# Patient Record
Sex: Female | Born: 2000 | Race: White | Hispanic: No | Marital: Single | Smoking: Never smoker
Health system: Southern US, Community
[De-identification: ages and names within clinical notes are randomized; demographics above are authoritative.]

## PROBLEM LIST (undated history)

## (undated) DIAGNOSIS — F419 Anxiety disorder, unspecified: Secondary | ICD-10-CM

## (undated) DIAGNOSIS — K219 Gastro-esophageal reflux disease without esophagitis: Secondary | ICD-10-CM

## (undated) DIAGNOSIS — K9041 Non-celiac gluten sensitivity: Secondary | ICD-10-CM

## (undated) DIAGNOSIS — K589 Irritable bowel syndrome without diarrhea: Secondary | ICD-10-CM

## (undated) DIAGNOSIS — T7840XA Allergy, unspecified, initial encounter: Secondary | ICD-10-CM

## (undated) DIAGNOSIS — M40209 Unspecified kyphosis, site unspecified: Secondary | ICD-10-CM

## (undated) DIAGNOSIS — F32A Depression, unspecified: Secondary | ICD-10-CM

## (undated) HISTORY — DX: Depression, unspecified: F32.A

## (undated) HISTORY — DX: Unspecified kyphosis, site unspecified: M40.209

## (undated) HISTORY — DX: Anxiety disorder, unspecified: F41.9

## (undated) HISTORY — DX: Non-celiac gluten sensitivity: K90.41

## (undated) HISTORY — PX: WISDOM TOOTH EXTRACTION: SHX21

## (undated) HISTORY — DX: Allergy, unspecified, initial encounter: T78.40XA

## (undated) HISTORY — DX: Gastro-esophageal reflux disease without esophagitis: K21.9

## (undated) HISTORY — DX: Irritable bowel syndrome, unspecified: K58.9

---

## 2020-10-07 ENCOUNTER — Ambulatory Visit: Payer: 59 | Admitting: Family Medicine

## 2020-11-27 ENCOUNTER — Other Ambulatory Visit: Payer: Self-pay

## 2020-11-28 ENCOUNTER — Ambulatory Visit (INDEPENDENT_AMBULATORY_CARE_PROVIDER_SITE_OTHER): Payer: 59 | Admitting: Family Medicine

## 2020-11-28 ENCOUNTER — Encounter: Payer: Self-pay | Admitting: Family Medicine

## 2020-11-28 VITALS — BP 106/68 | HR 108 | Temp 98.0°F | Ht 68.0 in | Wt 191.6 lb

## 2020-11-28 DIAGNOSIS — R051 Acute cough: Secondary | ICD-10-CM | POA: Diagnosis not present

## 2020-11-28 DIAGNOSIS — J0100 Acute maxillary sinusitis, unspecified: Secondary | ICD-10-CM | POA: Diagnosis not present

## 2020-11-28 DIAGNOSIS — F339 Major depressive disorder, recurrent, unspecified: Secondary | ICD-10-CM

## 2020-11-28 MED ORDER — AMOXICILLIN 500 MG PO CAPS: 500.0000 mg | ORAL_CAPSULE | Freq: Three times a day (TID) | ORAL | 0 refills | Status: AC

## 2020-11-28 MED ORDER — PREDNISONE 10 MG PO TABS: 10.0000 mg | ORAL_TABLET | Freq: Two times a day (BID) | ORAL | 0 refills | Status: AC

## 2020-11-28 NOTE — Progress Notes (Signed)
New Patient Office Visit  Subjective:  Patient ID: Karen Webb, female    DOB: [birth year]  Age: 20 y.o. MRN: [id number]  CC:  Chief Complaint  Patient presents with   Establish Care    NP/establish care concerns about cough and congestion x 1 week.     HPI Karen Webb presents for establishment of care and a 10-day history of URI symptoms with nasal congestion postnasal drip sore throat dry cough and headache.  There has been some improvement other than a lingering cough without wheeze or asthma history, facial pressure in her cheekbones and green rhinorrhea.  History of depression treated well with Wellbutrin XL.  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  She is in counseling.  Sees GYN for female care.  Past Medical History:  Diagnosis Date   Anxiety    Depression     Past Surgical History:  Procedure Laterality Date   WISDOM TOOTH EXTRACTION      Family History  Problem Relation Age of Onset   Alcohol abuse Mother    Depression Mother    Anxiety disorder Mother    Hypertension Father     Social History   Socioeconomic History   Marital status: Single    Spouse name: Not on file   Number of children: Not on file   Years of education: Not on file   Highest education level: Not on file  Occupational History   Not on file  Tobacco Use   Smoking status: Never   Smokeless tobacco: Never  Vaping Use   Vaping Use: Never used  Substance and Sexual Activity   Alcohol use: Never   Drug use: Never   Sexual activity: Yes    Birth control/protection: I.U.D.  Other Topics Concern   Not on file  Social History Narrative   Not on file   Social Determinants of Health   Financial Resource Strain: Not on file  Food Insecurity: Not on file  Transportation Needs: Not on file  Physical Activity: Not on file  Stress: Not on file  Social Connections: Not on file  Intimate Partner  Violence: Not on file    ROS Review of Systems  Constitutional:  Negative for diaphoresis, fatigue, fever and unexpected weight change.  HENT:  Positive for congestion, postnasal drip, rhinorrhea, sinus pressure and sinus pain. Negative for trouble swallowing.   Eyes:  Negative for photophobia and visual disturbance.  Respiratory:  Positive for cough. Negative for shortness of breath and wheezing.   Gastrointestinal:  Negative for abdominal pain, nausea and vomiting.  Genitourinary: Negative.   Musculoskeletal:  Negative for arthralgias and myalgias.  Skin:  Negative for rash.  Neurological:  Positive for headaches. Negative for speech difficulty and weakness.  Psychiatric/Behavioral:  Positive for dysphoric mood.   Depression screen PHQ 2/9 11/28/2020  Decreased Interest 0  Down, Depressed, Hopeless 0  PHQ - 2 Score 0     Objective:   Today's Vitals: BP 106/68 (BP Location: Right Arm, Patient Position: Sitting, Cuff Size: Normal)   Pulse (!) 108   Temp 98 F (36.7 C) (Temporal)   Ht 5\' 8"  (1.727 m)   Wt 191 lb 9.6 oz (86.9 kg)   SpO2 98%   BMI 29.13 kg/m   Physical Exam Vitals and nursing note reviewed.  Constitutional:      General: She is not in acute distress.    Appearance: Normal appearance. She  is not ill-appearing, toxic-appearing or diaphoretic.  HENT:     Head: Normocephalic and atraumatic.     Right Ear: Tympanic membrane, ear canal and external ear normal.     Left Ear: Tympanic membrane, ear canal and external ear normal.     Mouth/Throat:     Mouth: Mucous membranes are moist.     Pharynx: Oropharynx is clear. No oropharyngeal exudate or posterior oropharyngeal erythema.  Eyes:     General: No scleral icterus.       Right eye: No discharge.        Left eye: No discharge.     Extraocular Movements: Extraocular movements intact.     Conjunctiva/sclera: Conjunctivae normal.     Pupils: Pupils are equal, round, and reactive to light.  Cardiovascular:      Rate and Rhythm: Normal rate and regular rhythm.  Pulmonary:     Effort: Pulmonary effort is normal. No respiratory distress.     Breath sounds: Normal breath sounds. No wheezing or rales.  Musculoskeletal:     Cervical back: No rigidity or tenderness.  Lymphadenopathy:     Cervical: No cervical adenopathy.  Skin:    General: Skin is warm and dry.  Neurological:     Mental Status: She is alert and oriented to person, place, and time.  Psychiatric:        Mood and Affect: Mood normal.        Behavior: Behavior normal.    Assessment & Plan:   Problem List Items Addressed This Visit       Respiratory   Acute non-recurrent maxillary sinusitis - Primary   Relevant Medications   amoxicillin (AMOXIL) 500 MG capsule   predniSONE (DELTASONE) 10 MG tablet     Other   Depression, recurrent (HCC)   Relevant Medications   buPROPion (WELLBUTRIN XL) 150 MG 24 hr tablet   Acute cough   Relevant Medications   predniSONE (DELTASONE) 10 MG tablet    Outpatient Encounter Medications as of 11/28/2020  Medication Sig   amoxicillin (AMOXIL) 500 MG capsule Take 1 capsule (500 mg total) by mouth 3 (three) times daily for 10 days.   buPROPion (WELLBUTRIN XL) 150 MG 24 hr tablet Take 150 mg by mouth every morning.   levonorgestrel (KYLEENA) 19.5 MG IUD by Intrauterine route.   predniSONE (DELTASONE) 10 MG tablet Take 1 tablet (10 mg total) by mouth 2 (two) times daily with a meal for 7 days.   No facility-administered encounter medications on file as of 11/28/2020.    Follow-up: No follow-ups on file.   Karen Maw, MD

## 2021-01-28 ENCOUNTER — Ambulatory Visit: Payer: 59 | Admitting: Family Medicine

## 2021-02-17 ENCOUNTER — Other Ambulatory Visit: Payer: Self-pay

## 2021-02-17 ENCOUNTER — Encounter: Payer: Self-pay | Admitting: Family Medicine

## 2021-02-17 ENCOUNTER — Ambulatory Visit (INDEPENDENT_AMBULATORY_CARE_PROVIDER_SITE_OTHER): Payer: 59 | Admitting: Family Medicine

## 2021-02-17 ENCOUNTER — Ambulatory Visit: Payer: 59 | Admitting: Family Medicine

## 2021-02-17 VITALS — BP 130/78 | HR 104 | Temp 96.9°F | Ht 68.0 in | Wt 189.8 lb

## 2021-02-17 DIAGNOSIS — R634 Abnormal weight loss: Secondary | ICD-10-CM | POA: Diagnosis not present

## 2021-02-17 DIAGNOSIS — F339 Major depressive disorder, recurrent, unspecified: Secondary | ICD-10-CM | POA: Diagnosis not present

## 2021-02-17 DIAGNOSIS — Z114 Encounter for screening for human immunodeficiency virus [HIV]: Secondary | ICD-10-CM

## 2021-02-17 DIAGNOSIS — K921 Melena: Secondary | ICD-10-CM | POA: Diagnosis not present

## 2021-02-17 DIAGNOSIS — Z1159 Encounter for screening for other viral diseases: Secondary | ICD-10-CM | POA: Diagnosis not present

## 2021-02-17 LAB — COMPREHENSIVE METABOLIC PANEL
ALT: 5 U/L (ref 0–35)
AST: 15 U/L (ref 0–37)
Albumin: 4.2 g/dL (ref 3.5–5.2)
Alkaline Phosphatase: 48 U/L (ref 39–117)
BUN: 14 mg/dL (ref 6–23)
CO2: 29 mEq/L (ref 19–32)
Calcium: 9.4 mg/dL (ref 8.4–10.5)
Chloride: 104 mEq/L (ref 96–112)
Creatinine, Ser: 0.79 mg/dL (ref 0.40–1.20)
GFR: 107.23 mL/min (ref >60.00)
Glucose, Bld: 93 mg/dL (ref 70–99)
Potassium: 4 mEq/L (ref 3.5–5.1)
Sodium: 138 mEq/L (ref 135–145)
Total Bilirubin: 1 mg/dL (ref 0.2–1.2)
Total Protein: 6.5 g/dL (ref 6.0–8.3)

## 2021-02-17 LAB — CBC
HCT: 40.9 % (ref 36.0–46.0)
Hemoglobin: 13.6 g/dL (ref 12.0–15.0)
MCHC: 33.3 g/dL (ref 30.0–36.0)
MCV: 91.9 fl (ref 78.0–100.0)
Platelets: 186 10*3/uL (ref 150.0–400.0)
RBC: 4.45 Mil/uL (ref 3.87–5.11)
RDW: 13.1 % (ref 11.5–15.5)
WBC: 5.8 10*3/uL (ref 4.0–10.5)

## 2021-02-17 LAB — SEDIMENTATION RATE: Sed Rate: 3 mm/hr (ref 0–20)

## 2021-02-17 NOTE — Progress Notes (Signed)
Established Patient Office Visit  Subjective:  Patient ID: Karen Webb, female    DOB: 09-12-2000  Age: 21 y.o. MRN: [id number]  CC:  Chief Complaint  Patient presents with   Blood In Stools    Concerns about blood stool becoming worse patient would like referral to GI. Some stomach cramping.     HPI Karen Webb presents for evaluation of a 1 year history of intermittent hematochezia with intermittent abdominal pain and intestinal cramping.  There is been a 10 pound weight loss.  Stools may be loose or well formed.  There is some relief with abdominal pain with stooling and/or passing gas.  She appreciates reflux a few times a months relieved by Tums and/or food ingestion.  There have been no fevers or chills.  History of hemorrhoids but these have not been noted in some time now.  For contraception she uses an IUD and there is no menstrual flow.  Denies dysuria or discharge.  She is in a stable committed relationship.  Her mother has Crohn's disease.  Her significant other has ulcerative colitis.  Continues to do well with Wellbutrin XL.  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  Past Medical History:  Diagnosis Date   Anxiety    Depression     Past Surgical History:  Procedure Laterality Date   WISDOM TOOTH EXTRACTION      Family History  Problem Relation Age of Onset   Alcohol abuse Mother    Depression Mother    Anxiety disorder Mother    Hypertension Father     Social History   Socioeconomic History   Marital status: Single    Spouse name: Not on file   Number of children: Not on file   Years of education: Not on file   Highest education level: Not on file  Occupational History   Not on file  Tobacco Use   Smoking status: Never   Smokeless tobacco: Never  Vaping Use   Vaping Use: Never used  Substance and Sexual Activity   Alcohol use: Never   Drug use: Never   Sexual activity: Yes    Birth control/protection: I.U.D.  Other  Topics Concern   Not on file  Social History Narrative   Not on file   Social Determinants of Health   Financial Resource Strain: Not on file  Food Insecurity: Not on file  Transportation Needs: Not on file  Physical Activity: Not on file  Stress: Not on file  Social Connections: Not on file  Intimate Partner Violence: Not on file    Outpatient Medications Prior to Visit  Medication Sig Dispense Refill   buPROPion (WELLBUTRIN XL) 150 MG 24 hr tablet Take 150 mg by mouth every morning.     levonorgestrel (KYLEENA) 19.5 MG IUD by Intrauterine route.     No facility-administered medications prior to visit.    Not on File  ROS Review of Systems  Constitutional:  Negative for chills, diaphoresis, fatigue, fever and unexpected weight change.  HENT: Negative.    Eyes:  Negative for photophobia and visual disturbance.  Respiratory:  Positive for wheezing.   Cardiovascular: Negative.   Gastrointestinal:  Positive for abdominal pain and blood in stool. Negative for anal bleeding and constipation.  Neurological:  Negative for speech difficulty and weakness.  Hematological:  Does not bruise/bleed easily.  Depression screen [facility] 2/9 02/17/2021 11/28/2020 11/28/2020  Decreased Interest 0 1 0  Down, Depressed, Hopeless 0 3 0  PHQ -  2 Score 0 4 0  Altered sleeping - 3 -  Tired, decreased energy - 3 -  Change in appetite - 0 -  Feeling bad or failure about yourself  - 1 -  Trouble concentrating - 2 -  Moving slowly or fidgety/restless - 1 -  Suicidal thoughts - 0 -  PHQ-9 Score - 14 -  Difficult doing work/chores - Somewhat difficult -       Objective:    Physical Exam Vitals and nursing note reviewed.  Constitutional:      General: She is not in acute distress.    Appearance: Normal appearance. She is not ill-appearing, toxic-appearing or diaphoretic.  HENT:     Head: Normocephalic and atraumatic.     Right Ear: External ear normal.     Left Ear: External ear normal.      Mouth/Throat:     Mouth: Mucous membranes are moist.     Pharynx: Oropharynx is clear. No oropharyngeal exudate or posterior oropharyngeal erythema.  Eyes:     General:        Right eye: No discharge.        Left eye: No discharge.     Extraocular Movements: Extraocular movements intact.     Conjunctiva/sclera: Conjunctivae normal.     Pupils: Pupils are equal, round, and reactive to light.  Cardiovascular:     Rate and Rhythm: Normal rate and regular rhythm.  Pulmonary:     Effort: Pulmonary effort is normal.     Breath sounds: Normal breath sounds.  Abdominal:     General: Abdomen is flat. Bowel sounds are normal. There is no distension.     Palpations: Abdomen is soft. There is no mass.     Tenderness: There is abdominal tenderness (mild llq ttp.). There is no guarding or rebound.     Hernia: No hernia is present.  Musculoskeletal:     Cervical back: No rigidity or tenderness.  Lymphadenopathy:     Cervical: No cervical adenopathy.  Skin:    General: Skin is warm and dry.  Neurological:     Mental Status: She is alert and oriented to person, place, and time.  Psychiatric:        Mood and Affect: Mood normal.        Behavior: Behavior normal.    BP 130/78 (BP Location: Right Arm, Patient Position: Sitting, Cuff Size: Large)    Pulse (!) 104    Temp (!) 96.9 F (36.1 C) (Temporal)    Ht 5' 8" (1.727 m)    Wt 189 lb 12.8 oz (86.1 kg)    SpO2 98%    BMI 28.86 kg/m  Wt Readings from Last 3 Encounters:  02/17/21 189 lb 12.8 oz (86.1 kg)  11/28/20 191 lb 9.6 oz (86.9 kg)     Health Maintenance Due  Topic Date Due   CHLAMYDIA SCREENING  Never done   Hepatitis C Screening  Never done   PAP-Cervical Cytology Screening  02/10/2021   PAP SMEAR-Modifier  02/10/2021    There are no preventive care reminders to display for this patient.  No results found for: TSH No results found for: WBC, HGB, HCT, MCV, PLT No results found for: NA, K, CHLORIDE, CO2, GLUCOSE, BUN,  CREATININE, BILITOT, ALKPHOS, AST, ALT, PROT, ALBUMIN, CALCIUM, ANIONGAP, EGFR, GFR No results found for: CHOL No results found for: HDL No results found for: LDLCALC No results found for: TRIG No results found for: CHOLHDL No results found for: HGBA1C  Assessment & Plan:   Problem List Items Addressed This Visit       Other   Depression, recurrent ([place]) - Primary   Encounter for hepatitis C screening test for low risk patient   Relevant Orders   Hepatitis C antibody   Weight loss   Relevant Orders   Ambulatory referral to Gastroenterology   CBC   Comprehensive metabolic panel   Sedimentation rate   Hematochezia   Relevant Orders   Ambulatory referral to Gastroenterology   CBC    No orders of the defined types were placed in this encounter.   Follow-up: Return in about 3 months (around 05/17/2021).   Continue Wellbutrin 150 mg daily.  Information was given on hematochezia.  GI referral for consultation. [name], MD

## 2021-02-18 ENCOUNTER — Encounter: Payer: Self-pay | Admitting: Physician Assistant

## 2021-02-18 ENCOUNTER — Encounter: Payer: Self-pay | Admitting: Family Medicine

## 2021-02-18 DIAGNOSIS — K219 Gastro-esophageal reflux disease without esophagitis: Secondary | ICD-10-CM

## 2021-02-18 LAB — HEPATITIS C ANTIBODY
Hepatitis C Ab: NONREACTIVE
SIGNAL TO CUT-OFF: <0.02 (ref <1.00)

## 2021-02-18 LAB — HIV ANTIBODY (ROUTINE TESTING W REFLEX): HIV 1&2 Ab, 4th Generation: NONREACTIVE

## 2021-02-20 MED ORDER — PANTOPRAZOLE SODIUM 40 MG PO TBEC: 40.0000 mg | DELAYED_RELEASE_TABLET | Freq: Every day | ORAL | 3 refills | Status: DC

## 2021-02-28 ENCOUNTER — Ambulatory Visit: Payer: 59 | Admitting: Family Medicine

## 2021-02-28 NOTE — Progress Notes (Signed)
03/03/2021 Karen Webb [id number] 09/06/2000   ASSESSMENT AND PLAN: :  Hematochezia Hemorrhoids on exam- supp sent in Get KUB to evaluate stool burden, if large volume stools unlikely colitis and will start linzess sent in, if no stool burden on Xray will schedule for EGD/Colon -     CBC with Differential/Platelet; Future -     Comprehensive metabolic panel; Future -     TSH; Future -     DG Abd 1 View; Future -     IgA; Future -     Tissue transglutaminase, IgA; Future -     C-reactive protein; Future -     Calprotectin, Fecal; Future -     linaclotide (LINZESS) 145 MCG CAPS capsule; Take 1 capsule (145 mcg total) by mouth daily before breakfast.  Weight loss -     TSH; Future - per patient 9 lbs weight loss, per our records 2-3 lbs - check labs, consider imaging versus CT if continues  Gastroesophageal reflux disease without esophagitis -     H. pylori antibody, IgG; Future ? If URI she has been treated for is LPR - do trial of pantoprazole 40 mg once daily - avoid NSAIDS, ETOH.     Patient Care Team: [name], MD as PCP - General (Family Medicine)  HISTORY OF PRESENT ILLNESS: 21 y.o. female referred by [name],*, with a past medical history of depression and others listed below presents for evaluation of hematochezia and weight loss.   Of note patient is on Wellbutrin. Karen Webb boyfriend is here with her.   She has constipation, has BM once a week or every other day, has gone as long as 9 days without.  She states she has a history of hemorrhoids, has had mucus with BRB.  Had ripping sensation, BRB in toilet or on TP, some burning/itching pain.  Ab pain diffuse lower AB pain, went to GYN first  and now better.  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  She has nausea 1-2 x a day for a year, no vomiting. She has  GERD last 3 months. She was sick several months ago, URI that would not go away clearing, hoarseness, will have choking with mucus coming up, rare wheezing, no SOB.  Had melena when on prednisone per patient.   No dysphagia.  Rare ETOH, socially only. She vapes, no drug use.  Excedrin once every 2 days for headaches.   Per patient she is 9 lbs weight loss in 1 month.  Mom with crohn's, maternal GM with celiac.  Wt Readings from Last 3 Encounters:  03/03/21 188 lb 8 oz (85.5 kg)  02/17/21 189 lb 12.8 oz (86.1 kg)  11/28/20 191 lb 9.6 oz (86.9 kg)    External labs and notes reviewed this visit: CBC  02/17/2021  HGB 13.6 MCV 91.9 without evidence of anemia WBC 5.8 Platelets 186.0 Kidney function 02/17/2021  BUN 14 Cr 0.79  Potassium 4.0   LFTs 02/17/2021  AST 15 ALT 5 Alkphos 48 TBili 1.0 No TSH seen Sed rate negative  Current Medications:   Current Outpatient Medications (Endocrine & Metabolic):    levonorgestrel (KYLEENA) 19.5 MG IUD, by Intrauterine route.      Current Outpatient Medications (Other):    buPROPion (WELLBUTRIN XL) 150 MG 24 hr tablet, Take 150 mg by mouth every morning.   linaclotide Rolan Lipa)  145 MCG CAPS capsule, Take 1 capsule (145 mcg total) by mouth daily before breakfast.   pantoprazole (PROTONIX) 40 MG tablet, Take 1 tablet (40 mg total) by mouth daily.  Medical History:  Past Medical History:  Diagnosis Date   Anxiety    Depression    Gluten-sensitive enteropathy    IBS (irritable bowel syndrome)    Allergies: No Known Allergies   Surgical History:  She  has a past surgical history that includes Wisdom tooth extraction. Family History:  Her family history includes ADD / ADHD in her sister; Alcohol abuse in her mother; Anxiety disorder in her mother; Bipolar disorder in her mother; Celiac disease in her maternal grandmother; Crohn's disease in her mother; Depression in her mother and sister; Heart disease in her paternal grandfather; Hypertension  in her father; Irritable bowel syndrome in her mother; Other in her mother; Sleep apnea in her father. Social History:   reports that she has never smoked. She has never used smokeless tobacco. She reports current alcohol use. She reports that she does not use drugs.  REVIEW OF SYSTEMS  : All other systems reviewed and negative except where noted in the History of Present Illness.   PHYSICAL EXAM: BP 130/60 (BP Location: Left Arm, Patient Position: Sitting, Cuff Size: Normal)    Pulse (!) 104    Ht 5' 7.75" (1.721 m) Comment: height measured without shoes   Wt 188 lb 8 oz (85.5 kg)    BMI 28.87 kg/m  General:   Pleasant, well developed female in no acute distress Head:  Normocephalic and atraumatic. Eyes: sclerae anicteric,conjunctive pink  Heart:  tachycardia regular rhythm Pulm: Clear anteriorly; no wheezing Abdomen:  Soft, Obese AB, skin exam normal, Normal bowel sounds. mild tenderness in the lower abdomen. Without guarding and Without rebound, without hepatomegaly. Rectal: Normal external rectal exam, normal rectal tone, + internal hemorrhoids appreciated, no masses, non tender, scant brown stool, hemoccult negative  Extremities:  Without edema. Msk:  Symmetrical without gross deformities. Peripheral pulses intact.  Neurologic:  Alert and  oriented x4;  grossly normal neurologically. Skin:   Dry and intact without significant lesions or rashes. Psychiatric: Demonstrates good judgement and reason without abnormal affect or behaviors.   Vladimir Crofts, PA-C 3:08 PM

## 2021-03-03 ENCOUNTER — Ambulatory Visit (INDEPENDENT_AMBULATORY_CARE_PROVIDER_SITE_OTHER): Payer: 59 | Admitting: Physician Assistant

## 2021-03-03 ENCOUNTER — Other Ambulatory Visit (INDEPENDENT_AMBULATORY_CARE_PROVIDER_SITE_OTHER): Payer: 59

## 2021-03-03 ENCOUNTER — Other Ambulatory Visit: Payer: Self-pay

## 2021-03-03 ENCOUNTER — Encounter: Payer: Self-pay | Admitting: Physician Assistant

## 2021-03-03 ENCOUNTER — Ambulatory Visit (INDEPENDENT_AMBULATORY_CARE_PROVIDER_SITE_OTHER)
Admission: RE | Admit: 2021-03-03 | Discharge: 2021-03-03 | Disposition: A | Discharge status: Home / Self Care | Payer: 59 | Source: Ambulatory Visit | Attending: Physician Assistant | Admitting: Physician Assistant

## 2021-03-03 VITALS — BP 130/60 | HR 104 | Ht 67.75 in | Wt 188.5 lb

## 2021-03-03 DIAGNOSIS — R634 Abnormal weight loss: Secondary | ICD-10-CM

## 2021-03-03 DIAGNOSIS — K921 Melena: Secondary | ICD-10-CM

## 2021-03-03 DIAGNOSIS — K219 Gastro-esophageal reflux disease without esophagitis: Secondary | ICD-10-CM

## 2021-03-03 LAB — COMPREHENSIVE METABOLIC PANEL
ALT: 16 U/L (ref 0–35)
AST: 18 U/L (ref 0–37)
Albumin: 4.7 g/dL (ref 3.5–5.2)
Alkaline Phosphatase: 62 U/L (ref 39–117)
BUN: 7 mg/dL (ref 6–23)
CO2: 31 mEq/L (ref 19–32)
Calcium: 9.6 mg/dL (ref 8.4–10.5)
Chloride: 103 mEq/L (ref 96–112)
Creatinine, Ser: 0.69 mg/dL (ref 0.40–1.20)
GFR: 124.37 mL/min (ref >60.00)
Glucose, Bld: 99 mg/dL (ref 70–99)
Potassium: 3.7 mEq/L (ref 3.5–5.1)
Sodium: 138 mEq/L (ref 135–145)
Total Bilirubin: 0.7 mg/dL (ref 0.2–1.2)
Total Protein: 8 g/dL (ref 6.0–8.3)

## 2021-03-03 LAB — CBC WITH DIFFERENTIAL/PLATELET
Basophils Absolute: 0 10*3/uL (ref 0.0–0.1)
Basophils Relative: 0.7 % (ref 0.0–3.0)
Eosinophils Absolute: 0.1 10*3/uL (ref 0.0–0.7)
Eosinophils Relative: 0.8 % (ref 0.0–5.0)
HCT: 40 % (ref 36.0–46.0)
Hemoglobin: 13.8 g/dL (ref 12.0–15.0)
Lymphocytes Relative: 24.4 % (ref 12.0–46.0)
Lymphs Abs: 1.6 10*3/uL (ref 0.7–4.0)
MCHC: 34.5 g/dL (ref 30.0–36.0)
MCV: 87.5 fl (ref 78.0–100.0)
Monocytes Absolute: 0.5 10*3/uL (ref 0.1–1.0)
Monocytes Relative: 7.6 % (ref 3.0–12.0)
Neutro Abs: 4.3 10*3/uL (ref 1.4–7.7)
Neutrophils Relative %: 66.5 % (ref 43.0–77.0)
Platelets: 253 10*3/uL (ref 150.0–400.0)
RBC: 4.57 Mil/uL (ref 3.87–5.11)
RDW: 13 % (ref 11.5–15.5)
WBC: 6.5 10*3/uL (ref 4.0–10.5)

## 2021-03-03 LAB — C-REACTIVE PROTEIN: CRP: <1 mg/dL (ref 0.5–20.0)

## 2021-03-03 LAB — TSH: TSH: 1.64 u[IU]/mL (ref 0.35–5.50)

## 2021-03-03 MED ORDER — LINACLOTIDE 145 MCG PO CAPS: 145.0000 ug | ORAL_CAPSULE | Freq: Every day | ORAL | 0 refills | Status: DC

## 2021-03-03 MED ORDER — HYDROCORTISONE ACETATE 30 MG RE SUPP: 30.0000 mg | Freq: Every day | RECTAL | 1 refills | Status: DC

## 2021-03-03 NOTE — Patient Instructions (Addendum)
Linzess - START AFTER XRAY RESULTS ARE BACK Take at least 30 minutes before the first meal of the day on an empty stomach You can have a loose stool if you eat a high-fat breakfast. Give it at least 4 days, may have more bowel movements during that time.  If you continue to have severe diarrhea try every other day, if you get AB pain with it stop.  After you are out we can send in a prescription if you did well, there is a prescription card  Linaclotide oral capsules What is this medicine? LINACLOTIDE (lin a KLOE tide) is used to treat irritable bowel syndrome (IBS) with constipation as the main problem. It may also be used for relief of chronic constipation. This medicine may be used for other purposes; ask your health care provider or pharmacist if you have questions. COMMON BRAND NAME(S): Linzess What should I tell my health care provider before I take this medicine? They need to know if you have any of these conditions: history of stool (fecal) impaction now have diarrhea or have diarrhea often other medical condition stomach or intestinal disease, including bowel obstruction or abdominal adhesions an unusual or allergic reaction to linaclotide, other medicines, foods, dyes, or preservatives pregnant or trying to get pregnant breast-feeding How should I use this medicine? Take this medicine by mouth with a glass of water. Follow the directions on the prescription label. Do not cut, crush or chew this medicine. Take on an empty stomach, at least 30 minutes before your first meal of the day. Take your medicine at regular intervals. Do not take your medicine more often than directed. Do not stop taking except on your doctor's advice. A special MedGuide will be given to you by the pharmacist with each prescription and refill. Be sure to read this information carefully each time. Talk to your pediatrician regarding the use of this medicine in children. This medicine is not approved for use in  children. Overdosage: If you think you have taken too much of this medicine contact a poison control center or emergency room at once. NOTE: This medicine is only for you. Do not share this medicine with others. What if I miss a dose? If you miss a dose, just skip that dose. Wait until your next dose, and take only that dose. Do not take double or extra doses. What may interact with this medicine? certain medicines for bowel problems or bladder incontinence (these can cause constipation) This list may not describe all possible interactions. Give your health care provider a list of all the medicines, herbs, non-prescription drugs, or dietary supplements you use. Also tell them if you smoke, drink alcohol, or use illegal drugs. Some items may interact with your medicine. What should I watch for while using this medicine? Visit your doctor for regular check ups. Tell your doctor if your symptoms do not get better or if they get worse. Diarrhea is a common side effect of this medicine. It often begins within 2 weeks of starting this medicine. Stop taking this medicine and call your doctor if you get severe diarrhea. Stop taking this medicine and call your doctor or go to the nearest hospital emergency room right away if you develop unusual or severe stomach-area (abdominal) pain, especially if you also have bright red, bloody stools or black stools that look like tar. What side effects may I notice from receiving this medicine? Side effects that you should report to your doctor or health care professional as soon  as possible: allergic reactions like skin rash, itching or hives, swelling of the face, lips, or tongue black, tarry stools bloody or watery diarrhea new or worsening stomach pain severe or prolonged diarrhea Side effects that usually do not require medical attention (report to your doctor or health care professional if they continue or are bothersome): bloating gas loose stools This list  may not describe all possible side effects. Call your doctor for medical advice about side effects. You may report side effects to FDA at 1-800-FDA-1088. Where should I keep my medicine? Keep out of the reach of children. Store at room temperature between 20 and 25 degrees C (68 and 77 degrees F). Keep this medicine in the original container. Keep tightly closed in a dry place. Do not remove the desiccant packet from the bottle, it helps to protect your medicine from moisture. Throw away any unused medicine after the expiration date. NOTE: This sheet is a summary. It may not cover all possible information. If you have questions about this medicine, talk to your doctor, pharmacist, or health care provider.  2020 Elsevier/Gold Standard (2015-01-31 12:17:04)   Silent reflux: Not all heartburn burns...... GOING TO START ON pantoprazOLE 1 MONTH  What is LPR? Laryngopharyngeal reflux (LPR) or silent reflux is a condition in which acid that is made in the stomach travels up the esophagus (swallowing tube) and gets to the throat. Not everyone with reflux has a lot of heartburn or indigestion. In fact, many people with LPR never have heartburn. This is why LPR is called SILENT REFLUX, and the terms "Silent reflux" and "LPR" are often used interchangeably. Because LPR is silent, it is sometimes difficult to diagnose.  How can you tell if you have LPR?  Chronic hoarseness- Some people have hoarseness that comes and goes throat clearing  Cough It can cause shortness of breath and cause asthma like symptoms. a feeling of a lump in the throat  difficulty swallowing a problem with too much nose and throat drainage.  Some people will feel their esophagus spasm which feels like their heart beating hard and fast, this will usually be after a meal, at rest, or lying down at night.    How do I treat this? Treatment for LPR should be individualized, and your doctor will suggest the best treatment for you.  Generally there are several treatments for LPR: changing habits and diet to reduce reflux,  medications to reduce stomach acid, and  surgery to prevent reflux. Most people with LPR need to modify how and when they eat, as well as take some medication, to get well. Sometimes, nonprescription liquid antacids, such as Maalox, Gelucil and Mylanta are recommended. When used, these antacids should be taken four times each day - one tablespoon one hour after each meal and before bedtime. Dietary and lifestyle changes alone are not often enough to control LPR - medications that reduce stomach acid are also usually needed. These must be prescribed by our doctor.   TIPS FOR REDUCING REFLUX AND LPR Control your LIFE-STYLE and your DIET! If you use tobacco, QUIT.  Smoking makes you reflux. After every cigarette you have some LPR.  Don't wear clothing that is too tight, especially around the waist (trousers, corsets, belts).  Do not lie down just after eating...in fact, do not eat within three hours of bedtime.  You should be on a low-fat diet.  Limit your intake of red meat.  Limit your intake of butter.  Avoid fried foods.  Avoid chocolate  Avoid cheese.  Avoid eggs. Specifically avoid caffeine (especially coffee and tea), soda pop (especially cola) and mints.  Avoid alcoholic beverages, particularly in the evening.  Your provider has requested that you have an abdominal x ray before leaving today. Please go to the basement floor to our Radiology department for the test.   Your provider has requested that you go to the basement level for lab work before leaving today. Press "B" on the elevator. The lab is located at the first door on the left as you exit the elevator.   We have sent the following medications to your pharmacy for you to pick up at your convenience:  Linzess 145 mcg

## 2021-03-04 LAB — H. PYLORI ANTIBODY, IGG: H Pylori IgG: NEGATIVE

## 2021-03-04 LAB — IGA: Immunoglobulin A: 164 mg/dL (ref 47–310)

## 2021-03-04 LAB — TISSUE TRANSGLUTAMINASE, IGA: (tTG) Ab, IgA: <1 U/mL

## 2021-03-05 NOTE — Progress Notes (Signed)
I agree with assessment and plan as outlined by Ms. Collier. 

## 2021-03-06 ENCOUNTER — Other Ambulatory Visit: Payer: 59

## 2021-03-06 DIAGNOSIS — K921 Melena: Secondary | ICD-10-CM

## 2021-03-10 LAB — CALPROTECTIN, FECAL: Calprotectin, Fecal: 75 ug/g (ref 0–120)

## 2021-03-11 ENCOUNTER — Telehealth: Payer: Self-pay | Admitting: Physician Assistant

## 2021-03-11 NOTE — Telephone Encounter (Signed)
Inbound call from patient states Linzess even with discount card is over $400. She would need a replacement   Hydrocortisone 30mg  is expensive and the pharmacist advised if it is changed to at least 25mg  the cost could be as low as $35.

## 2021-03-13 MED ORDER — HYDROCORTISONE ACETATE 25 MG RE SUPP: 25.0000 mg | Freq: Every evening | RECTAL | 0 refills | Status: DC | PRN

## 2021-03-13 NOTE — Telephone Encounter (Signed)
I sent in the 25 mg Hydrocortisone suppositories for the patient. Karen Webb what do you want to give her in place of the Linzess, she said even with the card its $400. Thanks  ?

## 2021-03-13 NOTE — Telephone Encounter (Signed)
Sent this message in my chart: ? ?Try this instead.  ?Can do miralax 17 grams daily with benefiber 1 TBSP daily.  ?Miralax is an osmotic laxative.  ?It only brings more water into the stool.  ?This is safe to take daily.  ?Can take up to 17 gram of miralax twice a day.  ?Mix with juice or coffee.  ?Start 1 capful at night for 3-4 days and reassess your response in 3-4 days.  ?You can increase and decrease the dose based on your response.  ?Remember, it can take up to 3-4 days to take effect OR for the effects to wear off.  ? ?I often pair this with benefiber in the morning to help assure the stool is not too loose.  ?Thanks! ?

## 2021-03-27 ENCOUNTER — Other Ambulatory Visit: Payer: Self-pay

## 2021-03-27 ENCOUNTER — Telehealth: Payer: Self-pay | Admitting: Physician Assistant

## 2021-03-27 MED ORDER — DICYCLOMINE HCL 20 MG PO TABS: 20.0000 mg | ORAL_TABLET | ORAL | 0 refills | Status: DC | PRN

## 2021-03-27 NOTE — Telephone Encounter (Signed)
Spoke with patient regarding PA recommendations. She is aware that she will need to come in for lab work & prescription has been sent to pharmacy. Patient verbalized understanding.  ? ?Karen Webb, just fyi-patient has not been taking Linzess because it was too expensive. She has been doing just the miralax daily.  ?

## 2021-03-27 NOTE — Telephone Encounter (Signed)
Patient seen in the office 02/13/2021 for hematochezia. ?At that time patient had negative fecal calprotectin, negative CRP. ?Patient had negative celiac panel, negative H. pylori. ?At that time patient had no anemia. ?X-ray did show moderate stool burden, rectal exam showed hemorrhoids. ? ?She was given Linzess 145 mcg and hemorrhoid cream. ? ?Please get CBC, CMET, sed rate. ?See if patient is taking Linzess, if not what is she taking for stool burden. ?If unable to tolerate Linzess suggest doing MiraLAX twice daily with fiber. ?Can send in Bentyl 20 mg to take as needed up to 3 times daily for abdominal pain, #20 no refills. ?Go to the ER if unable to pass gas, severe AB pain, unable to hold down food, any shortness of breath of chest pain.  ? ?

## 2021-03-27 NOTE — Telephone Encounter (Signed)
Patient called in with complaints of abdominal pain & bloody stool. She woke up around 3am with excruciating mid-lower abd pain, went to the bathroom with what she thought was diarrhea but it was all bright red blood. Today the pain is better, but she is very lethargic and has a poor appetite. Patient advised to go to urgent care/ED if symptoms worsen in the meantime.  ? ?Karen Webb, please advise. Patient's appointment is not until 3/24 with you. Thank you! ?

## 2021-03-27 NOTE — Telephone Encounter (Signed)
Patient called reporting that she had severe stomach pain last night-woke her up out of a deep sleep.  Went to the bathroom and thought she was having diarrhea but it turned out to be all blood in the stool.  She is very weak and nauseous and feels bad.  She has an upcoming appointment with Estill Bamberg on 3/24 but needs to know what to do in the meantime.  Please call and advise.  Thank you. ?

## 2021-03-28 ENCOUNTER — Other Ambulatory Visit (INDEPENDENT_AMBULATORY_CARE_PROVIDER_SITE_OTHER): Payer: 59

## 2021-03-28 DIAGNOSIS — K921 Melena: Secondary | ICD-10-CM

## 2021-03-28 DIAGNOSIS — R1030 Lower abdominal pain, unspecified: Secondary | ICD-10-CM

## 2021-03-28 LAB — CBC WITH DIFFERENTIAL/PLATELET
Basophils Absolute: 0 10*3/uL (ref 0.0–0.1)
Basophils Relative: 0.6 % (ref 0.0–3.0)
Eosinophils Absolute: 0.1 10*3/uL (ref 0.0–0.7)
Eosinophils Relative: 3.3 % (ref 0.0–5.0)
HCT: 39.6 % (ref 36.0–46.0)
Hemoglobin: 13.7 g/dL (ref 12.0–15.0)
Lymphocytes Relative: 30.9 % (ref 12.0–46.0)
Lymphs Abs: 1.3 10*3/uL (ref 0.7–4.0)
MCHC: 34.7 g/dL (ref 30.0–36.0)
MCV: 87 fl (ref 78.0–100.0)
Monocytes Absolute: 0.6 10*3/uL (ref 0.1–1.0)
Monocytes Relative: 15.4 % — ABNORMAL HIGH (ref 3.0–12.0)
Neutro Abs: 2 10*3/uL (ref 1.4–7.7)
Neutrophils Relative %: 49.8 % (ref 43.0–77.0)
Platelets: 218 10*3/uL (ref 150.0–400.0)
RBC: 4.55 Mil/uL (ref 3.87–5.11)
RDW: 12.9 % (ref 11.5–15.5)
WBC: 4.1 10*3/uL (ref 4.0–10.5)

## 2021-03-28 LAB — COMPREHENSIVE METABOLIC PANEL
ALT: 22 U/L (ref 0–35)
AST: 22 U/L (ref 0–37)
Albumin: 4.7 g/dL (ref 3.5–5.2)
Alkaline Phosphatase: 61 U/L (ref 39–117)
BUN: 9 mg/dL (ref 6–23)
CO2: 27 mEq/L (ref 19–32)
Calcium: 9.7 mg/dL (ref 8.4–10.5)
Chloride: 104 mEq/L (ref 96–112)
Creatinine, Ser: 0.73 mg/dL (ref 0.40–1.20)
GFR: 117.8 mL/min (ref >60.00)
Glucose, Bld: 94 mg/dL (ref 70–99)
Potassium: 4.1 mEq/L (ref 3.5–5.1)
Sodium: 139 mEq/L (ref 135–145)
Total Bilirubin: 0.4 mg/dL (ref 0.2–1.2)
Total Protein: 7.8 g/dL (ref 6.0–8.3)

## 2021-03-28 LAB — SEDIMENTATION RATE: Sed Rate: 16 mm/hr (ref 0–20)

## 2021-04-03 NOTE — Progress Notes (Signed)
? ? ?04/04/2021 ?Karen Webb ?[id number] ?08-Apr-2000 ? ? ?ASSESSMENT AND PLAN:  ? ?Nocturnal diarrhea with hematochezia ?-     CBC with Differential/Platelet; Future ?Family history of celiac in maternal BM and crohn's in mother ?Has had weight loss since seen last visit, nocturnal symptoms of diarrhea, AB pain , while patient has negative lab work up with symptoms and family history do think it is appropriate to do endoscopic evaluation at this time.  ?I recommend upper gastrointestinal and colorectal evaluation with an EGD and colonoscopy.  ?Risk of bowel prep, conscious sedation, and EGD and colonoscopy were discussed. Risks include but are not limited to dehydration, pain, bleeding, cardiopulmonary process, bowel perforation, or other possible adverse outcomes.. Treatment plan was discussed with patient, and agreed upon. ? ?Pain in joint, multiple sites ?-     HLA-B27 antigen; Future ?-     DG Si Joints; Future ?Patient having bilateral hip/back pain, worse at night or rest, better with movement, with family history will check HLA B27 and SI joint xray to evaluate for AKSO ? ? ? ?History of Present Illness:  ?21 y.o. female  with a past medical history of depression, constipation, hemorrhoids and others listed below, known to Dr. Lorenso Courier returns to clinic today for evaluation of constipation, rectal bleeding, GERD ?Marland Kitchen ?Patient seen last in the office 03/03/2021, mother does have history of Crohn's, maternal grandmother with celiac.   ?Patient complaining of hematochezia and weight loss.  ?KUB showed moderate stool burden endoscopic valuation was not pursued. ?Labs at that visit showed no leukocytosis, no anemia hemoglobin 13.8, negative H. pylori, normal thyroid, normal kidney and liver ?negative celiac panel. ?Patient had normal fecal calprotectin at 75. ?Patient called back several times with abdominal discomfort and continuing hematochezia. ?Was given Linzess but states even with discount card was too  expensive, started on MiraLAX and fiber supplement. ? ?Patient had flare on 03/16, had normal BM 5 pm, went to bed then woke up at 330 in the morning with intense AB pain.  ?She had a lot of soft large volume stool with with BRB, then she had another BM with just blood. She had fatigue, nausea, thought she was going to pass out.  ? ?She took double dose of miralax with apple juice at 9pm, started to have AB pain, then midnight had progressive pain, would go back and forth to the bathroom with urge to go but no stool. Then around 2 AM had BM very soft BM with BRB blood in the toilet, then had another BM 330 AM with diarrhea no blood. Took dicyclomine that helped. ?Very bad odor.  ?She has had BM every 3-4 days, one day that she had 7 days between BM's. Has soft BM's.  ?Has not been having ripping feeling.  ? ?She has had nausea, no GERD with pantoprazole.  ?She has had bilateral pain in knee and hips, worse at night with hip pain, better with movement.  ?Has lost about 7 lbs since being seen last.  ?Wt Readings from Last 3 Encounters:  ?04/04/21 182 lb (82.6 kg)  ?03/03/21 188 lb 8 oz (85.5 kg)  ?02/17/21 189 lb 12.8 oz (86.1 kg)  ? ? ? ? ? ?Current Medications:  ? ?Current Outpatient Medications (Endocrine & Metabolic):  ?  levonorgestrel (KYLEENA) 19.5 MG IUD, by Intrauterine route. ? ? ? ? ? ?Current Outpatient Medications (Other):  ?  buPROPion (WELLBUTRIN XL) 150 MG 24 hr tablet, Take 150 mg by mouth every morning. ?  dicyclomine (  BENTYL) 20 MG tablet, Take 1 tablet (20 mg total) by mouth as needed (As needed up to three times a day for abdominal pain). ?  pantoprazole (PROTONIX) 40 MG tablet, Take 1 tablet (40 mg total) by mouth daily. ? ?Surgical History:  ?She  has a past surgical history that includes Wisdom tooth extraction. ?Family History:  ?Her family history includes ADD / ADHD in her sister; Alcohol abuse in her mother; Anxiety disorder in her mother; Bipolar disorder in her mother; Celiac disease in  her maternal grandmother; Crohn's disease in her mother; Depression in her mother and sister; Heart disease in her paternal grandfather; Hypertension in her father; Irritable bowel syndrome in her mother; Other in her mother; Sleep apnea in her father. ?Social History:  ? reports that she has never smoked. She has never used smokeless tobacco. She reports current alcohol use. She reports that she does not use drugs. ? ?Current Medications, Allergies, Past Medical History, Past Surgical History, Family History and Social History were reviewed in [employer] record. ? ?Physical Exam: ?BP 120/88   Pulse (!) 122   Ht 5' 7.75" (1.721 m)   Wt 182 lb (82.6 kg)   SpO2 99%   BMI 27.88 kg/m?  ?General:   Pleasant, well developed female in no acute distress ?Eyes: sclerae anicteric,conjunctive pink  ?Heart:  regular rate and rhythm ?Pulm: Clear anteriorly; no wheezing ?Abdomen:  Soft, Flat AB, skin exam normal, Normal bowel sounds. mild tenderness in the epigastrium and in the lower abdomen. Without guarding and Without rebound, without hepatomegaly. ?Extremities:  Without edema. Peripheral pulses intact. Full ROM back/hips without pain.  ?Neurologic:  Alert and  oriented x4;  grossly normal neurologically. ?Skin:   Dry and intact without significant lesions or rashes. ?Psychiatric: Demonstrates good judgement and reason without abnormal affect or behaviors. ? ?Vladimir Crofts, PA-C ?04/04/21 ?

## 2021-04-04 ENCOUNTER — Encounter: Payer: Self-pay | Admitting: Physician Assistant

## 2021-04-04 ENCOUNTER — Ambulatory Visit (INDEPENDENT_AMBULATORY_CARE_PROVIDER_SITE_OTHER)
Admission: RE | Admit: 2021-04-04 | Discharge: 2021-04-04 | Disposition: A | Discharge status: Home / Self Care | Payer: 59 | Source: Ambulatory Visit | Attending: Physician Assistant | Admitting: Physician Assistant

## 2021-04-04 ENCOUNTER — Ambulatory Visit (INDEPENDENT_AMBULATORY_CARE_PROVIDER_SITE_OTHER): Payer: 59 | Admitting: Physician Assistant

## 2021-04-04 ENCOUNTER — Other Ambulatory Visit: Payer: Self-pay

## 2021-04-04 ENCOUNTER — Other Ambulatory Visit (INDEPENDENT_AMBULATORY_CARE_PROVIDER_SITE_OTHER): Payer: 59

## 2021-04-04 VITALS — BP 120/88 | HR 122 | Ht 67.75 in | Wt 182.0 lb

## 2021-04-04 DIAGNOSIS — M2550 Pain in unspecified joint: Secondary | ICD-10-CM

## 2021-04-04 DIAGNOSIS — R197 Diarrhea, unspecified: Secondary | ICD-10-CM | POA: Diagnosis not present

## 2021-04-04 DIAGNOSIS — K921 Melena: Secondary | ICD-10-CM

## 2021-04-04 LAB — CBC WITH DIFFERENTIAL/PLATELET
Basophils Absolute: 0 10*3/uL (ref 0.0–0.1)
Basophils Relative: 0.5 % (ref 0.0–3.0)
Eosinophils Absolute: 0 10*3/uL (ref 0.0–0.7)
Eosinophils Relative: 0.5 % (ref 0.0–5.0)
HCT: 40.5 % (ref 36.0–46.0)
Hemoglobin: 14 g/dL (ref 12.0–15.0)
Lymphocytes Relative: 21.4 % (ref 12.0–46.0)
Lymphs Abs: 1.8 10*3/uL (ref 0.7–4.0)
MCHC: 34.5 g/dL (ref 30.0–36.0)
MCV: 86.4 fl (ref 78.0–100.0)
Monocytes Absolute: 0.5 10*3/uL (ref 0.1–1.0)
Monocytes Relative: 5.3 % (ref 3.0–12.0)
Neutro Abs: 6.2 10*3/uL (ref 1.4–7.7)
Neutrophils Relative %: 72.3 % (ref 43.0–77.0)
Platelets: 241 10*3/uL (ref 150.0–400.0)
RBC: 4.68 Mil/uL (ref 3.87–5.11)
RDW: 12.6 % (ref 11.5–15.5)
WBC: 8.5 10*3/uL (ref 4.0–10.5)

## 2021-04-04 NOTE — Patient Instructions (Addendum)
You have been scheduled for an endoscopy and colonoscopy. Please follow the written instructions given to you at your visit today. ?Please pick up your prep supplies at the pharmacy within the next 1-3 days. ?If you use inhalers (even only as needed), please bring them with you on the day of your procedure.  ? ?Your provider has requested that you have an x ray before leaving today. Please go to the basement floor to our Radiology department for the test.  ? ?Your provider has requested that you go to the basement level for lab work before leaving today. Press "B" on the elevator. The lab is located at the first door on the left as you exit the elevator.  ? ?We have given you a Plenvu sample kit today ? ?Due to recent changes in healthcare laws, you may see the results of your imaging and laboratory studies on MyChart before your provider has had a chance to review them.  We understand that in some cases there may be results that are confusing or concerning to you. Not all laboratory results come back in the same time frame and the provider may be waiting for multiple results in order to interpret others.  Please give [place] 48 hours in order for your provider to thoroughly review all the results before contacting the office for clarification of your results.   ? ?If you are age 21 or older, your body mass index should be between 23-30. Your Body mass index is 27.88 kg/m?Marland Kitchen If this is out of the aforementioned range listed, please consider follow up with your Primary Care Provider. ? ?If you are age 4 or younger, your body mass index should be between 19-25. Your Body mass index is 27.88 kg/m?Marland Kitchen If this is out of the aformentioned range listed, please consider follow up with your Primary Care Provider.  ? ?________________________________________________________ ? ?The [facility] providers would like to encourage you to use [facility] to communicate with providers for non-urgent requests or questions.  Due to long hold  times on the telephone, sending your provider a message by [facility] may be a faster and more efficient way to get a response.  Please allow 48 business hours for a response.  Please remember that this is for non-urgent requests.  ?_______________________________________________________  ? ?I appreciate the  opportunity to care for you ? ?Thank You  ? ?Amanda Collier,PA-C   ?

## 2021-04-06 LAB — HLA-B27 ANTIGEN: HLA-B27 Antigen: NEGATIVE

## 2021-04-06 NOTE — Progress Notes (Signed)
I agree with the assessment and plan as outlined by Ms. Collier. 

## 2021-05-01 ENCOUNTER — Encounter: Payer: Self-pay | Admitting: Internal Medicine

## 2021-05-08 ENCOUNTER — Ambulatory Visit (AMBULATORY_SURGERY_CENTER): Payer: 59 | Admitting: Internal Medicine

## 2021-05-08 ENCOUNTER — Telehealth: Payer: Self-pay | Admitting: *Deleted

## 2021-05-08 ENCOUNTER — Encounter: Payer: Self-pay | Admitting: Internal Medicine

## 2021-05-08 VITALS — BP 124/70 | HR 74 | Temp 99.1°F | Resp 11 | Ht 67.0 in | Wt 182.0 lb

## 2021-05-08 DIAGNOSIS — K648 Other hemorrhoids: Secondary | ICD-10-CM

## 2021-05-08 DIAGNOSIS — R197 Diarrhea, unspecified: Secondary | ICD-10-CM | POA: Diagnosis present

## 2021-05-08 DIAGNOSIS — K219 Gastro-esophageal reflux disease without esophagitis: Secondary | ICD-10-CM

## 2021-05-08 DIAGNOSIS — K2970 Gastritis, unspecified, without bleeding: Secondary | ICD-10-CM

## 2021-05-08 DIAGNOSIS — K921 Melena: Secondary | ICD-10-CM

## 2021-05-08 DIAGNOSIS — R1030 Lower abdominal pain, unspecified: Secondary | ICD-10-CM

## 2021-05-08 DIAGNOSIS — R634 Abnormal weight loss: Secondary | ICD-10-CM | POA: Diagnosis not present

## 2021-05-08 DIAGNOSIS — D123 Benign neoplasm of transverse colon: Secondary | ICD-10-CM

## 2021-05-08 DIAGNOSIS — K635 Polyp of colon: Secondary | ICD-10-CM

## 2021-05-08 DIAGNOSIS — K319 Disease of stomach and duodenum, unspecified: Secondary | ICD-10-CM | POA: Diagnosis not present

## 2021-05-08 HISTORY — PX: UPPER GASTROINTESTINAL ENDOSCOPY: SHX188

## 2021-05-08 HISTORY — PX: COLONOSCOPY: SHX174

## 2021-05-08 MED ORDER — PANTOPRAZOLE SODIUM 40 MG PO TBEC: 40.0000 mg | DELAYED_RELEASE_TABLET | Freq: Two times a day (BID) | ORAL | 0 refills | Status: DC

## 2021-05-08 MED ORDER — SODIUM CHLORIDE 0.9 % IV SOLN: 500.0000 mL | Freq: Once | INTRAVENOUS | Status: DC

## 2021-05-08 NOTE — Op Note (Signed)
[place] ?Patient Name: Karen Webb ?Procedure Date: 05/08/2021 12:48 PM ?MRN: [id number] ?Endoscopist: Sonny Masters "Christia Reading ,  ?Age: 21 ?Referring MD:  ?Date of Birth: Sep 26, 2000 ?Gender: Female ?Account #: [id number] ?Procedure:                Colonoscopy ?Indications:              Lower abdominal pain, Diarrhea, Hematochezia ?Medicines:                Monitored Anesthesia Care ?Procedure:                Pre-Anesthesia Assessment: ?                          - Prior to the procedure, a History and Physical  ?                          was performed, and patient medications and  ?                          allergies were reviewed. The patient's tolerance of  ?                          previous anesthesia was also reviewed. The risks  ?                          and benefits of the procedure and the sedation  ?                          options and risks were discussed with the patient.  ?                          All questions were answered, and informed consent  ?                          was obtained. Prior Anticoagulants: The patient has  ?                          taken no previous anticoagulant or antiplatelet  ?                          agents. ASA Grade Assessment: II - A patient with  ?                          mild systemic disease. After reviewing the risks  ?                          and benefits, the patient was deemed in  ?                          satisfactory condition to undergo the procedure. ?                          After obtaining informed consent, the colonoscope  ?  was passed under direct vision. Throughout the  ?                          procedure, the patient's blood pressure, pulse, and  ?                          oxygen saturations were monitored continuously. The  ?                          CF [id number] #[id number] was introduced through the anus  ?                          and advanced to the the terminal ileum. The  ?                          colonoscopy  was performed without difficulty. The  ?                          patient tolerated the procedure well. The quality  ?                          of the bowel preparation was adequate. The terminal  ?                          ileum, ileocecal valve, appendiceal orifice, and  ?                          rectum were photographed. ?Scope In: 1:55:07 PM ?Scope Out: 2:15:47 PM ?Scope Withdrawal Time: 0 hours 15 minutes 14 seconds  ?Total Procedure Duration: 0 hours 20 minutes 40 seconds  ?Findings:                 The terminal ileum appeared normal. ?                          A 3 mm polyp was found in the transverse colon. The  ?                          polyp was sessile. The polyp was removed with a  ?                          cold snare. Resection and retrieval were complete. ?                          Non-bleeding internal hemorrhoids were found during  ?                          retroflexion. ?                          Biopsies were taken with a cold forceps in the  ?                          entire colon for histology. ?Complications:  No immediate complications. ?Estimated Blood Loss:     Estimated blood loss was minimal. ?Impression:               - The examined portion of the ileum was normal. ?                          - One 3 mm polyp in the transverse colon, removed  ?                          with a cold snare. Resected and retrieved. ?                          - Non-bleeding internal hemorrhoids. ?                          - Biopsies were taken with a cold forceps for  ?                          histology in the entire colon. ?Recommendation:           - Discharge patient to home (with escort). ?                          - Await pathology results. ?                          - Return to GI clinic in 1 month. ?                          - The findings and recommendations were discussed  ?                          with the patient. ?Georgian Co,  ?05/08/2021 2:23:49 PM ?

## 2021-05-08 NOTE — Patient Instructions (Signed)
Information on polyps and gastritis given to you. ? ?Await pathology results from the biopsies taken today. ? ?Protonix 40 mg - take twice a day for 8 weeks.  Sent to your pharmacy. ? ?Resume previous diet and medications. ? ?Return to GI clinic in 1 month. ? ? ?YOU HAD AN ENDOSCOPIC PROCEDURE TODAY AT [facility]:   Refer to the procedure report that was given to you for any specific questions about what was found during the examination.  If the procedure report does not answer your questions, please call your gastroenterologist to clarify.  If you requested that your care partner not be given the details of your procedure findings, then the procedure report has been included in a sealed envelope for you to review at your convenience later. ? ?YOU SHOULD EXPECT: Some feelings of bloating in the abdomen. Passage of more gas than usual.  Walking can help get rid of the air that was put into your GI tract during the procedure and reduce the bloating. If you had a lower endoscopy (such as a colonoscopy or flexible sigmoidoscopy) you may notice spotting of blood in your stool or on the toilet paper. If you underwent a bowel prep for your procedure, you may not have a normal bowel movement for a few days. ? ?Please Note:  You might notice some irritation and congestion in your nose or some drainage.  This is from the oxygen used during your procedure.  There is no need for concern and it should clear up in a day or so. ? ?SYMPTOMS TO REPORT IMMEDIATELY: ? ?Following lower endoscopy (colonoscopy or flexible sigmoidoscopy): ? Excessive amounts of blood in the stool ? Significant tenderness or worsening of abdominal pains ? Swelling of the abdomen that is new, acute ? Fever of 100?F or higher ? ?Following upper endoscopy (EGD) ? Vomiting of blood or coffee ground material ? New chest pain or pain under the shoulder blades ? Painful or persistently difficult swallowing ? New shortness of breath ? Fever of  100?F or higher ? Black, tarry-looking stools ? ?For urgent or emergent issues, a gastroenterologist can be reached at any hour by calling [phone]. ?Do not use MyChart messaging for urgent concerns.  ? ? ?DIET:  We do recommend a small meal at first, but then you may proceed to your regular diet.  Drink plenty of fluids but you should avoid alcoholic beverages for 24 hours. ? ?ACTIVITY:  You should plan to take it easy for the rest of today and you should NOT DRIVE or use heavy machinery until tomorrow (because of the sedation medicines used during the test).   ? ?FOLLOW UP: ?Our staff will call the number listed on your records 48-72 hours following your procedure to check on you and address any questions or concerns that you may have regarding the information given to you following your procedure. If we do not reach you, we will leave a message.  We will attempt to reach you two times.  During this call, we will ask if you have developed any symptoms of COVID 19. If you develop any symptoms (ie: fever, flu-like symptoms, shortness of breath, cough etc.) before then, please call [phone].  If you test positive for Covid 19 in the 2 weeks post procedure, please call and report this information to [place].   ? ?If any biopsies were taken you will be contacted by phone or by letter within the next 1-3 weeks.  Please call us  at [phone] if you have not heard about the biopsies in 3 weeks.  ? ? ?SIGNATURES/CONFIDENTIALITY: ?You and/or your care partner have signed paperwork which will be entered into your electronic medical record.  These signatures attest to the fact that that the information above on your After Visit Summary has been reviewed and is understood.  Full responsibility of the confidentiality of this discharge information lies with you and/or your care-partner.  ?

## 2021-05-08 NOTE — Progress Notes (Signed)
? ?GASTROENTEROLOGY PROCEDURE H&P NOTE  ? ?Primary Care Physician: ?[name], MD ? ? ? ?Reason for Procedure:   Diarrhea, hematochezia, family history of celiac disease and Crohn's disease ? ?Plan:    EGD/colonoscopy ? ?Patient is appropriate for endoscopic procedure(s) in the ambulatory ([facility]) setting. ? ?The nature of the procedure, as well as the risks, benefits, and alternatives were carefully and thoroughly reviewed with the patient. Ample time for discussion and questions allowed. The patient understood, was satisfied, and agreed to proceed.  ? ? ? ?HPI: ?Karen Webb is a 21 y.o. female who presents for EGD/colonoscopy for evaluation of diarrhea, hematochezia, and family history of celiac disease  and Crohn's disease.  Patient was most recently seen in the Gastroenterology Clinic on 04/04/21.  No interval change in medical history since that appointment. Please refer to that note for full details regarding GI history and clinical presentation.  ? ?Past Medical History:  ?Diagnosis Date  ? Allergy   ? Anxiety   ? Depression   ? GERD (gastroesophageal reflux disease)   ? Gluten-sensitive enteropathy   ? IBS (irritable bowel syndrome)   ? Kyphosis   ? slight  ? ? ?Past Surgical History:  ?Procedure Laterality Date  ? WISDOM TOOTH EXTRACTION    ? ? ?Prior to Admission medications   ?Medication Sig Start Date End Date Taking? Authorizing Provider  ?buPROPion (WELLBUTRIN XL) 150 MG 24 hr tablet Take 150 mg by mouth every morning. 08/22/20  Yes [provider]  ?cetirizine (ZYRTEC) 10 MG tablet Take 10 mg by mouth daily. Prn allergies   Yes [provider]  ?fluticasone (FLONASE) 50 MCG/ACT nasal spray Place into both nostrils daily. Prn allergies   Yes [provider]  ?pantoprazole (PROTONIX) 40 MG tablet Take 1 tablet (40 mg total) by mouth daily. 02/20/21  Yes [name], MD  ?dicyclomine (BENTYL) 20 MG tablet Take 1 tablet (20 mg total) by mouth as needed  (As needed up to three times a day for abdominal pain). 03/27/21   Vladimir Crofts, PA-C  ?levonorgestrel [facility]) 19.5 MG IUD by Intrauterine route.    [provider]  ? ? ?Current Outpatient Medications  ?Medication Sig Dispense Refill  ? buPROPion (WELLBUTRIN XL) 150 MG 24 hr tablet Take 150 mg by mouth every morning.    ? cetirizine (ZYRTEC) 10 MG tablet Take 10 mg by mouth daily. Prn allergies    ? fluticasone (FLONASE) 50 MCG/ACT nasal spray Place into both nostrils daily. Prn allergies    ? pantoprazole (PROTONIX) 40 MG tablet Take 1 tablet (40 mg total) by mouth daily. 30 tablet 3  ? dicyclomine (BENTYL) 20 MG tablet Take 1 tablet (20 mg total) by mouth as needed (As needed up to three times a day for abdominal pain). 20 tablet 0  ? levonorgestrel (KYLEENA) 19.5 MG IUD by Intrauterine route.    ? ?Current Facility-Administered Medications  ?Medication Dose Route Frequency Provider Last Rate Last Admin  ? 0.9 %  sodium chloride infusion  500 mL Intravenous Once Sharyn Creamer, MD      ? ? ?Allergies as of 05/08/2021  ? (No Known Allergies)  ? ? ?Family History  ?Problem Relation Age of Onset  ? Alcohol abuse Mother   ? Depression Mother   ? Anxiety disorder Mother   ? Crohn's disease Mother   ? Other Mother   ?     gluten sensitive  ? Irritable bowel syndrome Mother   ?  Bipolar disorder Mother   ? Hypertension Father   ? Sleep apnea Father   ? Depression Sister   ? ADD / ADHD Sister   ? Celiac disease Maternal Grandmother   ? Heart disease Paternal Grandfather   ? Colon cancer Neg Hx   ? Colon polyps Neg Hx   ? Esophageal cancer Neg Hx   ? Rectal cancer Neg Hx   ? Stomach cancer Neg Hx   ? ? ?Social History  ? ?Socioeconomic History  ? Marital status: Single  ?  Spouse name: Not on file  ? Number of children: 0  ? Years of education: Not on file  ? Highest education level: Not on file  ?Occupational History  ? Occupation: Estate manager/land agent  ?Tobacco Use  ? Smoking status: Never  ?  Smokeless tobacco: Never  ?Vaping Use  ? Vaping Use: Every day  ?Substance and Sexual Activity  ? Alcohol use: Yes  ?  Comment: occasional  ? Drug use: Never  ? Sexual activity: Yes  ?  Birth control/protection: I.U.D.  ?Other Topics Concern  ? Not on file  ?Social History Narrative  ? Not on file  ? ?Social Determinants of Health  ? ?Financial Resource Strain: Not on file  ?Food Insecurity: Not on file  ?Transportation Needs: Not on file  ?Physical Activity: Not on file  ?Stress: Not on file  ?Social Connections: Not on file  ?Intimate Partner Violence: Not on file  ? ? ?Physical Exam: ?Vital signs in last 24 hours: ?BP 112/79   Pulse (!) 109   Temp 99.1 ?F (37.3 ?C)   Ht '5\' 7"'$  (1.702 m)   Wt 182 lb (82.6 kg)   SpO2 99%   BMI 28.51 kg/m?  ?GEN: NAD ?EYE: Sclerae anicteric ?ENT: MMM ?CV: Non-tachycardic ?Pulm: No increased WOB ?GI: Soft ?NEURO:  Alert & Oriented ? ? ?Christia Reading, MD ?[facility] ? ? ?05/08/2021 1:32 PM ? ?

## 2021-05-08 NOTE — Progress Notes (Signed)
Report to PACU, RN, vss, BBS= Clear.  

## 2021-05-08 NOTE — Telephone Encounter (Signed)
Pt called and states that she had vomiting with plenvu prep last night and does not think she will be able to drink it this morning.  She was able to force it down last night and did have several bowel movements to the point it is liquid.  She is asking if she can do miralax this morning.  She has a 1/2 of a 238 g bottle of Miralax at home and also gatorade.  Dr. Lorenso Courier please advise.   ?

## 2021-05-08 NOTE — Op Note (Signed)
[facility] ?Patient Name: Karen Webb ?Procedure Date: 05/08/2021 12:49 PM ?MRN: [id number] ?Endoscopist: Sonny Masters "Christia Reading ,  ?Age: 21 ?Referring MD:  ?Date of Birth: 04-15-2000 ?Gender: Female ?Account #: [id number] ?Procedure:                Upper GI endoscopy ?Indications:              Diarrhea, Weight loss ?Medicines:                Monitored Anesthesia Care ?Procedure:                Pre-Anesthesia Assessment: ?                          - Prior to the procedure, a History and Physical  ?                          was performed, and patient medications and  ?                          allergies were reviewed. The patient's tolerance of  ?                          previous anesthesia was also reviewed. The risks  ?                          and benefits of the procedure and the sedation  ?                          options and risks were discussed with the patient.  ?                          All questions were answered, and informed consent  ?                          was obtained. Prior Anticoagulants: The patient has  ?                          taken no previous anticoagulant or antiplatelet  ?                          agents. ASA Grade Assessment: II - A patient with  ?                          mild systemic disease. After reviewing the risks  ?                          and benefits, the patient was deemed in  ?                          satisfactory condition to undergo the procedure. ?                          After obtaining informed consent, the endoscope was  ?  passed under direct vision. Throughout the  ?                          procedure, the patient's blood pressure, pulse, and  ?                          oxygen saturations were monitored continuously. The  ?                          GIF [id number] #[id number] was introduced through the  ?                          mouth, and advanced to the second part of duodenum.  ?                          The upper GI endoscopy was  accomplished without  ?                          difficulty. The patient tolerated the procedure  ?                          well. ?Scope In: ?Scope Out: ?Findings:                 The examined esophagus was normal. Biopsies were  ?                          taken with a cold forceps for histology. ?                          Localized severe inflammation characterized by  ?                          congestion (edema), erythema and friability was  ?                          found in the gastric antrum. Biopsies were taken  ?                          with a cold forceps for histology. ?                          The examined duodenum was normal. Biopsies were  ?                          taken with a cold forceps for histology. ?Complications:            No immediate complications. ?Estimated Blood Loss:     Estimated blood loss was minimal. ?Impression:               - Normal esophagus. Biopsied. ?                          - Gastritis. Biopsied. ?                          -  Normal examined duodenum. Biopsied. ?Recommendation:           - Await pathology results. ?                          - Use Protonix (pantoprazole) 40 mg PO BID for 8  ?                          weeks. ?                          - Perform a colonoscopy today. ?Georgian Co,  ?05/08/2021 2:20:24 PM ?

## 2021-05-08 NOTE — Progress Notes (Signed)
Called to room to assist during endoscopic procedure.  Patient ID and intended procedure confirmed with present staff. Received instructions for my participation in the procedure from the performing physician.  

## 2021-05-08 NOTE — Telephone Encounter (Signed)
Spoke with Dr. Lorenso Courier.  She advises that pt can use Miralax and gatorade this morning.  Called pt and instructed her to mix the half bottle with 32 oz gatorade and drink 8 oz every 15 minutes until finished.  She is to follow that with 16 oz of water.  Pt verbalized understanding.   ?

## 2021-05-08 NOTE — Progress Notes (Signed)
Took 1st bot of Plenvu last pm.  n&v immediately after drinking prep.  Pt call this am and was instructed to take took 1/2 botle of Miralax this am.  Pt reports green clear liquid last BM.Marland Kitchen ?

## 2021-05-12 ENCOUNTER — Telehealth: Payer: Self-pay

## 2021-05-12 NOTE — Telephone Encounter (Signed)
?  Follow up Call- ? ? ?  05/08/2021  ? 12:49 PM  ?Call back number  ?Post procedure Call Back phone  # [phone] cell  ?Permission to leave phone message Yes  ?  ? ?Patient questions: ? ?Do you have a fever, pain , or abdominal swelling? No. ?Pain Score  0 * ? ?Have you tolerated food without any problems? Yes.   ? ?Have you been able to return to your normal activities? Yes.   ? ?Do you have any questions about your discharge instructions: ?Diet   No. ?Medications  No. ?Follow up visit  No. ? ?Do you have questions or concerns about your Care? No. ? ?Actions: ?* If pain score is 4 or above: ?No action needed, pain <4. ? ? ?

## 2021-05-14 ENCOUNTER — Encounter: Payer: Self-pay | Admitting: Internal Medicine

## 2021-05-18 ENCOUNTER — Other Ambulatory Visit: Payer: Self-pay | Admitting: Family Medicine

## 2021-05-18 DIAGNOSIS — K2970 Gastritis, unspecified, without bleeding: Secondary | ICD-10-CM

## 2021-05-19 ENCOUNTER — Ambulatory Visit: Payer: 59 | Admitting: Family Medicine

## 2021-05-22 ENCOUNTER — Other Ambulatory Visit: Payer: Self-pay | Admitting: Family Medicine

## 2021-05-30 ENCOUNTER — Other Ambulatory Visit: Payer: Self-pay | Admitting: Internal Medicine

## 2021-05-30 DIAGNOSIS — K2970 Gastritis, unspecified, without bleeding: Secondary | ICD-10-CM

## 2021-06-13 ENCOUNTER — Ambulatory Visit (INDEPENDENT_AMBULATORY_CARE_PROVIDER_SITE_OTHER): Payer: 59 | Admitting: Internal Medicine

## 2021-06-13 ENCOUNTER — Encounter: Payer: Self-pay | Admitting: Internal Medicine

## 2021-06-13 VITALS — BP 120/80 | HR 112 | Ht 67.75 in | Wt 190.5 lb

## 2021-06-13 DIAGNOSIS — K2970 Gastritis, unspecified, without bleeding: Secondary | ICD-10-CM | POA: Diagnosis not present

## 2021-06-13 DIAGNOSIS — K2990 Gastroduodenitis, unspecified, without bleeding: Secondary | ICD-10-CM

## 2021-06-13 DIAGNOSIS — R1030 Lower abdominal pain, unspecified: Secondary | ICD-10-CM | POA: Diagnosis not present

## 2021-06-13 MED ORDER — PANTOPRAZOLE SODIUM 40 MG PO TBEC: 40.0000 mg | DELAYED_RELEASE_TABLET | Freq: Two times a day (BID) | ORAL | 3 refills | Status: DC

## 2021-06-13 NOTE — Progress Notes (Signed)
Chief Complaint: Diarrhea, abdominal pain  HPI : 21 year old female with history of GERD and IBS presents for follow up diarrhea and abdominal pain  Interval History: She is not having any abdominal pain since she has been on PPI medication. She is taking PPI BID. Denies chest burning, regurgitation. Occasionally nauseous. Denies vomiting. She is no longer having any diarrhea. The joints in her hips are still hurting only when she sleeps. Sometimes she wakes up in the night due to her hips hurting. She is not taking anything for the hip pain.   Current Outpatient Medications  Medication Sig Dispense Refill   buPROPion (WELLBUTRIN XL) 150 MG 24 hr tablet TAKE 1 TABLET BY MOUTH EVERY DAY IN THE MORNING 90 tablet 1   cetirizine (ZYRTEC) 10 MG tablet Take 10 mg by mouth daily. Prn allergies     dicyclomine (BENTYL) 20 MG tablet Take 1 tablet (20 mg total) by mouth as needed (As needed up to three times a day for abdominal pain). 20 tablet 0   fluticasone (FLONASE) 50 MCG/ACT nasal spray Place into both nostrils daily. Prn allergies     levonorgestrel (KYLEENA) 19.5 MG IUD by Intrauterine route.     pantoprazole (PROTONIX) 40 MG tablet Take 1 tablet (40 mg total) by mouth 2 (two) times daily. 112 tablet 0   No current facility-administered medications for this visit.   Review of Systems: All systems reviewed and negative except where noted in HPI.   Physical Exam: Ht 5' 7.75" (1.721 m)   Wt 190 lb 8 oz (86.4 kg)   BMI 29.18 kg/m  Constitutional: Pleasant,well-developed, female in no acute distress. HEENT: Normocephalic and atraumatic. Conjunctivae are normal. No scleral icterus. Cardiovascular: Normal rate, regular rhythm.  Pulmonary/chest: Effort normal and breath sounds normal. No wheezing, rales or rhonchi. Abdominal: Soft, nondistended, nontender. Bowel sounds active throughout. There are no masses palpable. No hepatomegaly. Extremities: No edema Neurological: Alert and oriented to  person place and time. Skin: Skin is warm and dry. No rashes noted. Psychiatric: Normal mood and affect. Behavior is normal.  Labs 02/2021: CMP nml. H pylori antibody negative. TSH nml. TTG IgA nml. IgA nml. CRP negative. Fecal calprotectin nml.  Labs 03/2021: CBC nml. HLA-B27 antigen nml. Fecal calprotectin nml.   EGD 05/08/21: - Normal esophagus. Biopsied. - Gastritis. Biopsied. - Normal examined duodenum. Biopsied Path: 1. Surgical [P], duodenal - BENIGN DUODENAL MUCOSA - NO ACUTE INFLAMMATION, VILLOUS BLUNTING OR INCREASED INTRAEPITHELIAL LYMPHOCYTES IDENTIFIED 2. Surgical [P], gastric - REACTIVE GASTROPATHY - NO H. PYLORI OR INTESTINAL METAPLASIA IDENTIFIED 3. Surgical [P], esophageal - BENIGN SQUAMOUS MUCOSA - NO INCREASED INTRAEPITHELIAL EOSINOPHILS  Colonoscopy 05/08/21: - The examined portion of the ileum was normal. - One 3 mm polyp in the transverse colon, removed with a cold snare. Resected and retrieved. - Non-bleeding internal hemorrhoids. - Biopsies were taken with a cold forceps for histology in the entire colon. Path: 4. Surgical [P], random colon - BENIGN COLONIC MUCOSA - NO ACTIVE INFLAMMATION OR EVIDENCE OF MICROSCOPIC COLITIS - NO HIGH-GRADE DYSPLASIA OR MALIGNANCY IDENTIFIED 5. Surgical [P], colon, transverse, polyp (1) - BENIGN COLONIC MUCOSA WITH UNDERLYING LYMPHOID AGGREGATE (2 OF 2 FRAGMENTS) - NO HIGH-GRADE DYSPLASIA OR MALIGNANCY IDENTIFIED  ASSESSMENT AND PLAN: Gastritis Abdominal pain Patient has had significant improvement in her abdominal pain on Protonix BID, which suggests that her ab pain was most likely due to gastritis and/or GERD. Will have patient continue her PPI BID for now, and consider decreasing her dose of  PPI at her next clinic visit. Patient does still have some issues with hip pain. Her last set of labs showed that her HLA-B27 antigen was normal, suggesting against ankylosing spondylitis. - Check PPI BID. Refill. - Patient will  reach out PCP to inquire about her hip pain and depression - RTC 3 months  Christia Reading, MD  I spent 40 minutes of time, including in depth chart review, independent review of results as outlined above, communicating results with the patient directly, face-to-face time with the patient, coordinating care, ordering studies and medications as appropriate, and documentation.

## 2021-06-13 NOTE — Patient Instructions (Signed)
If you are age 21 or older, your body mass index should be between 23-30. Your Body mass index is 29.18 kg/m. If this is out of the aforementioned range listed, please consider follow up with your Primary Care Provider.  If you are age 58 or younger, your body mass index should be between 19-25. Your Body mass index is 29.18 kg/m. If this is out of the aformentioned range listed, please consider follow up with your Primary Care Provider.   We have sent the following medications to your pharmacy for you to pick up at your convenience: Pantoprazole 40 mg   ________________________________________________________  The [facility] providers would like to encourage you to use [facility] to communicate with providers for non-urgent requests or questions.  Due to long hold times on the telephone, sending your provider a message by [facility] may be a faster and more efficient way to get a response.  Please allow 48 business hours for a response.  Please remember that this is for non-urgent requests.  _______________________________________________________  Thank you for entrusting me with your care and for choosing [facility], Dr. Christia Reading

## 2021-08-04 ENCOUNTER — Encounter: Payer: Self-pay | Admitting: Internal Medicine

## 2021-09-09 ENCOUNTER — Ambulatory Visit (INDEPENDENT_AMBULATORY_CARE_PROVIDER_SITE_OTHER): Payer: 59 | Admitting: Internal Medicine

## 2021-09-09 ENCOUNTER — Encounter: Payer: Self-pay | Admitting: Internal Medicine

## 2021-09-09 VITALS — BP 130/90 | HR 95 | Ht 67.0 in | Wt 187.0 lb

## 2021-09-09 DIAGNOSIS — R197 Diarrhea, unspecified: Secondary | ICD-10-CM

## 2021-09-09 DIAGNOSIS — R1030 Lower abdominal pain, unspecified: Secondary | ICD-10-CM | POA: Diagnosis not present

## 2021-09-09 DIAGNOSIS — K2970 Gastritis, unspecified, without bleeding: Secondary | ICD-10-CM | POA: Diagnosis not present

## 2021-09-09 MED ORDER — PANTOPRAZOLE SODIUM 40 MG PO TBEC: 40.0000 mg | DELAYED_RELEASE_TABLET | Freq: Every day | ORAL | 2 refills | Status: DC

## 2021-09-09 NOTE — Patient Instructions (Addendum)
If you are age 21 or younger, your body mass index should be between 19-25. Your Body mass index is 29.29 kg/m. If this is out of the aformentioned range listed, please consider follow up with your Primary Care Provider.   __________________________________________________________  The [facility] providers would like to encourage you to use [facility] to communicate with providers for non-urgent requests or questions.  Due to long hold times on the telephone, sending your provider a message by [facility] may be a faster and more efficient way to get a response.  Please allow 48 business hours for a response.  Please remember that this is for non-urgent requests.   Due to recent changes in healthcare laws, you may see the results of your imaging and laboratory studies on MyChart before your provider has had a chance to review them.  We understand that in some cases there may be results that are confusing or concerning to you. Not all laboratory results come back in the same time frame and the provider may be waiting for multiple results in order to interpret others.  Please give [place] 48 hours in order for your provider to thoroughly review all the results before contacting the office for clarification of your results.   We have sent the following medications to your pharmacy for you to pick up at your convenience: Pantoprazole  Take Pantoprazole once daily.  Follow Low - Fod map diet  Please follow up in 6 months. Give [place] a call at [phone] to schedule an appointment.    Thank you for entrusting me with your care and for choosing [facility],  Dr. Christia Reading

## 2021-09-09 NOTE — Progress Notes (Signed)
Chief Complaint: Diarrhea, abdominal pain  HPI : 21 year old female with history of GERD and IBS presents for follow up diarrhea and abdominal pain  Interval History: Overall she has been feeling well. The PPI therapy has been very helpful to her. When she misses a full day's PPI, she can definitely notice a difference. She had a week in July when she felt terrible with nausea and abdominal pain. She was under more stress at that time because she was house sitting for her dad and did not have her Bentyl on hand to use. Her stomach eventually calmed down after about a week. Otherwise, she has had some ab pain on occasion right before she has a BM. She has been trying to follow a healthy diet. She is holding down 3 jobs. 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  Current Outpatient Medications  Medication Sig Dispense Refill   buPROPion (WELLBUTRIN XL) 150 MG 24 hr tablet TAKE 1 TABLET BY MOUTH EVERY DAY IN THE MORNING 90 tablet 1   cetirizine (ZYRTEC) 10 MG tablet Take 10 mg by mouth daily. Prn allergies     dicyclomine (BENTYL) 20 MG tablet Take 1 tablet (20 mg total) by mouth as needed (As needed up to three times a day for abdominal pain). 20 tablet 0   fluticasone (FLONASE) 50 MCG/ACT nasal spray Place into both nostrils daily. Prn allergies     levonorgestrel (KYLEENA) 19.5 MG IUD by Intrauterine route.     pantoprazole (PROTONIX) 40 MG tablet Take 1 tablet (40 mg total) by mouth daily. 90 tablet 2   No current facility-administered medications for this visit.   Review of Systems: All systems reviewed and negative except where noted in HPI.   Physical Exam: BP (!) 130/90   Pulse 95   Ht 5' 7"  (1.702 m)   Wt 187 lb (84.8 kg)   BMI 29.29 kg/m  Constitutional: Pleasant,well-developed, female in no acute distress. HEENT: Normocephalic and atraumatic. Conjunctivae are normal. No scleral icterus. Cardiovascular: Normal rate, regular rhythm.  Pulmonary/chest:  Effort normal and breath sounds normal. No wheezing, rales or rhonchi. Abdominal: Soft, nondistended, nontender. Bowel sounds active throughout. There are no masses palpable. No hepatomegaly. Extremities: No edema Neurological: Alert and oriented to person place and time. Skin: Skin is warm and dry. No rashes noted. Psychiatric: Normal mood and affect. Behavior is normal.  Labs 02/2021: CMP nml. H pylori antibody negative. TSH nml. TTG IgA nml. IgA nml. CRP negative. Fecal calprotectin nml.  Labs 03/2021: CBC nml. HLA-B27 antigen nml. Fecal calprotectin nml.   EGD 05/08/21: - Normal esophagus. Biopsied. - Gastritis. Biopsied. - Normal examined duodenum. Biopsied Path: 1. Surgical [P], duodenal - BENIGN DUODENAL MUCOSA - NO ACUTE INFLAMMATION, VILLOUS BLUNTING OR INCREASED INTRAEPITHELIAL LYMPHOCYTES IDENTIFIED 2. Surgical [P], gastric - REACTIVE GASTROPATHY - NO H. PYLORI OR INTESTINAL METAPLASIA IDENTIFIED 3. Surgical [P], esophageal - BENIGN SQUAMOUS MUCOSA - NO INCREASED INTRAEPITHELIAL EOSINOPHILS  Colonoscopy 05/08/21: - The examined portion of the ileum was normal. - One 3 mm polyp in the transverse colon, removed with a cold snare. Resected and retrieved. - Non-bleeding internal hemorrhoids. - Biopsies were taken with a cold forceps for histology in the entire colon. Path: 4. Surgical [P], random colon - BENIGN COLONIC MUCOSA - NO ACTIVE INFLAMMATION OR EVIDENCE OF MICROSCOPIC COLITIS - NO HIGH-GRADE DYSPLASIA OR MALIGNANCY IDENTIFIED 5. Surgical [P], colon, transverse, polyp (1) - BENIGN COLONIC MUCOSA WITH UNDERLYING LYMPHOID AGGREGATE (2 OF 2 FRAGMENTS) -  NO HIGH-GRADE DYSPLASIA OR MALIGNANCY IDENTIFIED  ASSESSMENT AND PLAN: History of gastritis Abdominal pain Patient is doing well overall but has occasional flares of ab pain and nausea. I recommended that she try a low FODMAP diet as an aid to help identify potential trigger foods for her symptoms. Patient has been  doing well on PPI therapy so will have her decrease to QD as a trial to see if she can tolerate the lower frequency. - Low FODMAP diet - Reduce PPI BID to QD - Continue Bentyl PRN - Patient has an appt with her PCP tomorrow - RTC 6 months  Christia Reading, MD  I spent 40 minutes of time, including in depth chart review, independent review of results as outlined above, communicating results with the patient directly, face-to-face time with the patient, coordinating care, ordering studies and medications as appropriate, and documentation.

## 2021-09-10 ENCOUNTER — Encounter: Payer: Self-pay | Admitting: Family Medicine

## 2021-09-10 ENCOUNTER — Ambulatory Visit (INDEPENDENT_AMBULATORY_CARE_PROVIDER_SITE_OTHER): Payer: 59 | Admitting: Family Medicine

## 2021-09-10 VITALS — BP 118/72 | HR 90 | Temp 97.5°F | Ht 67.0 in | Wt 184.8 lb

## 2021-09-10 DIAGNOSIS — F339 Major depressive disorder, recurrent, unspecified: Secondary | ICD-10-CM | POA: Diagnosis not present

## 2021-09-10 DIAGNOSIS — S39012A Strain of muscle, fascia and tendon of lower back, initial encounter: Secondary | ICD-10-CM | POA: Diagnosis not present

## 2021-09-10 MED ORDER — MELOXICAM 7.5 MG PO TABS: 7.5000 mg | ORAL_TABLET | Freq: Every day | ORAL | 0 refills | Status: DC

## 2021-09-10 MED ORDER — ESCITALOPRAM OXALATE 10 MG PO TABS: 10.0000 mg | ORAL_TABLET | Freq: Every day | ORAL | 1 refills | Status: DC

## 2021-09-10 NOTE — Progress Notes (Signed)
Established Patient Office Visit  Subjective   Patient ID: Karen Webb, female    DOB: January 10, 2001  Age: 21 y.o. MRN: [id number]  Chief Complaint  Patient presents with   Back Pain    C/O back pains becoming worse x 1 month unable to do at home chores. States that Bupropion not working any more.     Back Pain Pertinent negatives include no abdominal pain, chest pain, tingling, weakness or weight loss.   for follow-up of depression and a month-long history of lower back pain.  Pain is located in the middle part of her back.  There is no radiation of pain.  There is no change in bowel or bladder function or saddle paresthesias.  There is no weakness.  Continues with 150 mg of Wellbutrin XL daily.  Disparate depression and stress levels are elevated.  She is in her last semester of college.  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  She is also working 3 jobs.  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    Review of Systems  Constitutional:  Negative for chills, diaphoresis, malaise/fatigue and weight loss.  HENT: Negative.    Eyes: Negative.  Negative for blurred vision and double vision.  Respiratory: Negative.    Cardiovascular:  Negative for chest pain.  Gastrointestinal:  Negative for abdominal pain.  Genitourinary: Negative.   Musculoskeletal:  Positive for back pain. Negative for falls and myalgias.  Neurological:  Negative for tingling, speech change, loss of consciousness and weakness.  Psychiatric/Behavioral: Negative.        Objective:     BP 118/72 (BP Location: Right Arm, Patient Position: Sitting, Cuff Size: Large)   Pulse 90   Temp (!) 97.5 F (36.4 C) (Temporal)   Ht '5\' 7"'$  (1.702 m)   Wt 184 lb 12.8 oz (83.8 kg)   SpO2 99%   BMI 28.94 kg/m  Wt Readings from Last 3 Encounters:  09/10/21 184 lb 12.8 oz (83.8 kg)  09/09/21 187 lb (84.8 kg)  06/13/21 190 lb 8 oz (86.4 kg)      Physical  Exam Constitutional:      General: She is not in acute distress.    Appearance: Normal appearance. She is not ill-appearing, toxic-appearing or diaphoretic.  HENT:     Head: Normocephalic and atraumatic.     Right Ear: External ear normal.     Left Ear: External ear normal.  Eyes:     General: No scleral icterus.       Right eye: No discharge.        Left eye: No discharge.     Extraocular Movements: Extraocular movements intact.     Conjunctiva/sclera: Conjunctivae normal.  Pulmonary:     Effort: Pulmonary effort is normal. No respiratory distress.  Musculoskeletal:     Cervical back: No rigidity or tenderness.     Lumbar back: No deformity or tenderness. Decreased range of motion. Negative right straight leg raise test and negative left straight leg raise test.  Skin:    General: Skin is warm and dry.  Neurological:     Mental Status: She is alert and oriented to person, place, and time.     Motor: Motor function is intact. No weakness.     Deep Tendon Reflexes:     Reflex Scores:      Patellar reflexes are 1+ on the right side and 1+ on  the left side.      Achilles reflexes are 1+ on the right side and 1+ on the left side. Psychiatric:        Mood and Affect: Mood normal.        Behavior: Behavior normal.      No results found for any visits on 09/10/21.    The ASCVD Risk score (Arnett DK, et al., 2019) failed to calculate for the following reasons:   The 2019 ASCVD risk score is only valid for ages 51 to 35    Assessment & Plan:   Problem List Items Addressed This Visit       Other   Depression, recurrent ([place]) - Primary   Relevant Medications   escitalopram (LEXAPRO) 10 MG tablet   Other Relevant Orders   Ambulatory referral to Psychiatry   Other Visit Diagnoses     Strain of lumbar region, initial encounter       Relevant Medications   meloxicam (MOBIC) 7.5 MG tablet   Other Relevant Orders   Ambulatory referral to Physical Therapy       Return  in about 6 weeks (around 10/22/2021), or if symptoms worsen or fail to improve.  We will start Lexapro.  Advised patient to adjust the time of day that works best for her.  Continue Wellbutrin.  We will start low-dose meloxicam.  Back exercises were given.  We will start physical therapy.  Consider sports medicine referral if not improving.  Patient requests psychiatry referral.  [name], MD

## 2021-09-12 ENCOUNTER — Encounter: Payer: Self-pay | Admitting: Family Medicine

## 2021-09-12 ENCOUNTER — Telehealth: Payer: Self-pay | Admitting: Family Medicine

## 2021-09-15 ENCOUNTER — Encounter: Payer: Self-pay | Admitting: Internal Medicine

## 2021-09-16 ENCOUNTER — Other Ambulatory Visit: Payer: Self-pay

## 2021-09-16 MED ORDER — HYDROCORTISONE (PERIANAL) 2.5 % EX CREA: 1.0000 | TOPICAL_CREAM | Freq: Two times a day (BID) | CUTANEOUS | 1 refills | Status: AC

## 2021-09-16 NOTE — Telephone Encounter (Signed)
Patient have started taking '5mg'$  of Lexapro with food at night time due to causing nausea wanting to know if this is okay? Patient still having some nauseous feeling not sure if this should be changed to something different or continue on '5mg'$  for a few more weeks? Please advise

## 2021-09-24 ENCOUNTER — Encounter: Payer: Self-pay | Admitting: Physical Therapy

## 2021-09-24 ENCOUNTER — Ambulatory Visit: Payer: 59 | Attending: Family Medicine | Admitting: Physical Therapy

## 2021-09-24 DIAGNOSIS — R293 Abnormal posture: Secondary | ICD-10-CM | POA: Diagnosis present

## 2021-09-24 DIAGNOSIS — M4125 Other idiopathic scoliosis, thoracolumbar region: Secondary | ICD-10-CM | POA: Insufficient documentation

## 2021-09-24 DIAGNOSIS — M6281 Muscle weakness (generalized): Secondary | ICD-10-CM | POA: Insufficient documentation

## 2021-09-24 DIAGNOSIS — R278 Other lack of coordination: Secondary | ICD-10-CM | POA: Insufficient documentation

## 2021-09-24 DIAGNOSIS — S39012A Strain of muscle, fascia and tendon of lower back, initial encounter: Secondary | ICD-10-CM | POA: Diagnosis not present

## 2021-09-24 NOTE — Therapy (Signed)
OUTPATIENT PHYSICAL THERAPY THORACOLUMBAR EVALUATION   Patient Name: Karen Webb MRN: [id number] DOB:07/29/2000, 21 y.o., female Today's Date: 09/24/2021   PT End of Session - 09/24/21 1444     Visit Number 1    Date for PT Re-Evaluation 11/19/21    PT Start Time 1319    PT Stop Time 1356    PT Time Calculation (min) 37 min    Activity Tolerance Patient tolerated treatment well    Behavior During Therapy [facility] for tasks assessed/performed             Past Medical History:  Diagnosis Date   Allergy    Anxiety    Depression    GERD (gastroesophageal reflux disease)    Gluten-sensitive enteropathy    IBS (irritable bowel syndrome)    Kyphosis    slight   Past Surgical History:  Procedure Laterality Date   COLONOSCOPY  05/08/2021   UPPER GASTROINTESTINAL ENDOSCOPY  05/08/2021   WISDOM TOOTH EXTRACTION     Patient Active Problem List   Diagnosis Date Noted   Encounter for hepatitis C screening test for low risk patient 02/17/2021   Weight loss 02/17/2021   Hematochezia 02/17/2021   Depression, recurrent ([place]) 11/28/2020   Acute cough 11/28/2020   Acute non-recurrent maxillary sinusitis 11/28/2020    PCP: [name], MD   REFERRING PROVIDER: [name], MD   REFERRING DIAG: Diagnosis S39.012A (ICD-10-CM) - Strain of lumbar region, initial encounter   Rationale for Evaluation and Treatment Rehabilitation  THERAPY DIAG:  Abnormal posture  Muscle weakness (generalized)  Other lack of coordination  Other idiopathic scoliosis, thoracolumbar region  ONSET DATE: 09/10/2021   SUBJECTIVE:                                                                                                                                                                                           SUBJECTIVE STATEMENT: Patient reports back pain since about age 54. She was found to have kyphosis and scoliosis.Her pain has been tolerable until about 2 months ago  and she is now at the point where she has difficulty shaving her legs or picking up her cat. No known incident to cause the pain.  PERTINENT HISTORY:  Back Pain Pertinent negatives include no abdominal pain, chest pain, tingling, weakness or weight loss.   for follow-up of depression and a month-long history of lower back pain.  Pain is located in the middle part of her back.  There is no radiation of pain.  There is no change in bowel or bladder function or saddle paresthesias.  There is no weakness.  Continues with 150  mg of Wellbutrin XL daily.  Disparate depression and stress levels are elevated.  She is in her last semester of college.  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  She is also working 3 jobs.  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    PAIN:  Are you having pain? Yes: NPRS scale: 9/10 Pain location: Mid/low back, right in the middle Pain description: burning, aching Aggravating factors: Standing, bending Relieving factors: Anti inflammatories, muscle relaxers offer temporary relief.   PRECAUTIONS: None  WEIGHT BEARING RESTRICTIONS No  FALLS:  Has patient fallen in last 6 months? 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  PLOF: Independent  PATIENT GOALS Establish HEP to help with the pain. Needs to limit her visits due to finances.   OBJECTIVE:   DIAGNOSTIC FINDINGS:  N/A  PATIENT SURVEYS:  Modified Oswestry    FOTO 29  SCREENING FOR RED FLAGS: Bowel or bladder incontinence: No Spinal tumors: No Cauda equina syndrome: No Compression fracture: No Abdominal aneurysm: No  COGNITION:  Overall cognitive status: Within functional limits for  tasks assessed     SENSATION: Not tested  MUSCLE LENGTH: Hamstrings: Right 62 deg; Left 54 deg Thomas test: B hip flexor tightness noted.  POSTURE: right pelvic obliquity and R shoulder elevation, B pes cavus  PALPATION: TTP along lower thoracic, upper lumbar paraspinals to just above illiac crests, B  LUMBAR ROM:   Active  A/PROM  eval  Flexion Mid shin  Extension WNL  Right lateral flexion To knee  Left lateral flexion 3" above knee  Right rotation WNL  Left rotation WNL   (Blank rows = not tested)  LOWER EXTREMITY ROM:   tight hamstrings and hip flexors, mildly diminished DF ROM  LOWER EXTREMITY MMT:  BLE strength (4+)-5/5 throughout, but trunk strength and stability are both diminished.  LUMBAR SPECIAL TESTS:  Slump test: Negative  FUNCTIONAL TESTS:  5 times sit to stand: 10.47 SLS- holds for > 10 sec on each limb  GAIT: Distance walked: 20' Assistive device utilized: None Level of assistance: Complete Independence Comments: No major gait deviations noted.    TODAY'S TREATMENT  Education   PATIENT EDUCATION:  Education details: POC, informal HEP for stretch Person educated: Patient Education method: Customer service manager Education comprehension: verbalized understanding   HOME EXERCISE PROGRAM: Verbal HEP to stretch HS, calves  ASSESSMENT:  CLINICAL IMPRESSION: Patient is a 21 y.o. who was seen today for physical therapy evaluation and treatment for mid back pain. She reports H/O scoliosis and kyphosis. She demonstrates tightness in hips, weakness in core and postural musculature which are largely contributing to her pain. She will benefit from PT to address her ROM and strength issues. She would also like education regarding sleeping positions of comfort. She has financial concerns and would like to minimize her number of visits as possible.   OBJECTIVE IMPAIRMENTS decreased activity tolerance, decreased coordination, decreased ROM,  decreased strength, impaired flexibility, postural dysfunction, and pain.   ACTIVITY LIMITATIONS carrying, lifting, bending, sleeping, and stairs  PARTICIPATION LIMITATIONS: cleaning, laundry, shopping, community activity, and occupation  PERSONAL FACTORS 1 comorbidity: H/O scoliosis and kyphosis  are also affecting patient's functional outcome.  REHAB POTENTIAL: Good  CLINICAL DECISION MAKING: Stable/uncomplicated  EVALUATION COMPLEXITY: Low   GOALS: Goals reviewed with patient? Yes  SHORT TERM GOALS: Target date: 10/08/21  I with basic HEP Baseline: Goal status: INITIAL  LONG TERM GOALS: Target date: 12/03/2021  I with final HEP Baseline:  Goal status: INITIAL  2.  Increase FOTO score to at least 48 Baseline: 29 Goal status: INITIAL  3.  Decrease Owestry score to < 5 Baseline: 23 Goal status: INITIAL  4.  Patient will experience back pain of < 3/5 at any time after completing her full day of activities. Baseline:  Goal status: INITIAL   PLAN: PT FREQUENCY: 1x/week  PT DURATION: 8 weeks  PLANNED INTERVENTIONS: Therapeutic exercises, Therapeutic activity, Neuromuscular re-education, Balance training, Gait training, Patient/Family education, Self Care, Joint mobilization, Dry Needling, Electrical stimulation, Cryotherapy, Moist heat, Ionotophoresis '4mg'$ /ml Dexamethasone, and Manual therapy.  PLAN FOR NEXT SESSION: HEP for strength and stretch   Marcelina Morel, DPT 09/24/2021, 2:46 PM

## 2021-09-30 ENCOUNTER — Ambulatory Visit (HOSPITAL_BASED_OUTPATIENT_CLINIC_OR_DEPARTMENT_OTHER): Payer: 59 | Admitting: Psychiatry

## 2021-09-30 VITALS — BP 139/90 | HR 119 | Wt 185.0 lb

## 2021-09-30 DIAGNOSIS — F331 Major depressive disorder, recurrent, moderate: Secondary | ICD-10-CM

## 2021-09-30 MED ORDER — HYDROXYZINE HCL 10 MG PO TABS: 10.0000 mg | ORAL_TABLET | Freq: Three times a day (TID) | ORAL | 2 refills | Status: AC | PRN

## 2021-09-30 MED ORDER — BUPROPION HCL ER (XL) 300 MG PO TB24: 300.0000 mg | ORAL_TABLET | Freq: Every day | ORAL | 2 refills | Status: DC

## 2021-09-30 NOTE — Patient Instructions (Signed)
Follow-up in 4 weeks
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
## 2021-10-01 ENCOUNTER — Encounter (HOSPITAL_COMMUNITY): Payer: Self-pay | Admitting: Psychiatry

## 2021-10-01 NOTE — Telephone Encounter (Signed)
ERROR

## 2021-10-08 ENCOUNTER — Ambulatory Visit: Payer: 59 | Admitting: Physical Therapy

## 2021-10-08 ENCOUNTER — Other Ambulatory Visit: Payer: Self-pay | Admitting: Family Medicine

## 2021-10-08 ENCOUNTER — Encounter: Payer: Self-pay | Admitting: Physical Therapy

## 2021-10-08 DIAGNOSIS — R293 Abnormal posture: Secondary | ICD-10-CM | POA: Diagnosis not present

## 2021-10-08 DIAGNOSIS — S39012A Strain of muscle, fascia and tendon of lower back, initial encounter: Secondary | ICD-10-CM

## 2021-10-08 DIAGNOSIS — M6281 Muscle weakness (generalized): Secondary | ICD-10-CM

## 2021-10-08 DIAGNOSIS — R278 Other lack of coordination: Secondary | ICD-10-CM

## 2021-10-08 DIAGNOSIS — M4125 Other idiopathic scoliosis, thoracolumbar region: Secondary | ICD-10-CM

## 2021-10-08 NOTE — Therapy (Signed)
OUTPATIENT PHYSICAL THERAPY THORACOLUMBAR TREATMENT   Patient Name: Karen Webb MRN: [id number] DOB:2000/05/28, 21 y.o., female Today's Date: 10/08/2021   PT End of Session - 10/08/21 1300     Visit Number 2    Date for PT Re-Evaluation 11/19/21    PT Start Time 1300    PT Stop Time 8295    PT Time Calculation (min) 45 min    Activity Tolerance Patient tolerated treatment well    Behavior During Therapy [facility] for tasks assessed/performed             Past Medical History:  Diagnosis Date   Allergy    Anxiety    Depression    GERD (gastroesophageal reflux disease)    Gluten-sensitive enteropathy    IBS (irritable bowel syndrome)    Kyphosis    slight   Past Surgical History:  Procedure Laterality Date   COLONOSCOPY  05/08/2021   UPPER GASTROINTESTINAL ENDOSCOPY  05/08/2021   WISDOM TOOTH EXTRACTION     Patient Active Problem List   Diagnosis Date Noted   Encounter for hepatitis C screening test for low risk patient 02/17/2021   Weight loss 02/17/2021   Hematochezia 02/17/2021   Depression, recurrent ([place]) 11/28/2020   Acute cough 11/28/2020   Acute non-recurrent maxillary sinusitis 11/28/2020    PCP: [name], MD   REFERRING PROVIDER: [name], MD   REFERRING DIAG: Diagnosis S39.012A (ICD-10-CM) - Strain of lumbar region, initial encounter   Rationale for Evaluation and Treatment Rehabilitation  THERAPY DIAG:  Muscle weakness (generalized)  Abnormal posture  Other lack of coordination  Other idiopathic scoliosis, thoracolumbar region  ONSET DATE: 09/10/2021   SUBJECTIVE:                                                                                                                                                                                           SUBJECTIVE STATEMENT: Was doing ok, but pick her cat and the pain returned.   PERTINENT HISTORY:  Back Pain Pertinent negatives include no abdominal pain, chest  pain, tingling, weakness or weight loss.   for follow-up of depression and a month-long history of lower back pain.  Pain is located in the middle part of her back.  There is no radiation of pain.  There is no change in bowel or bladder function or saddle paresthesias.  There is no weakness.  Continues with 150 mg of Wellbutrin XL daily.  Disparate depression and stress levels are elevated.  She is in her last semester of college.  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  She is also working 3 jobs.  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    PAIN:  Are you having pain? Yes: NPRS scale: 4/10 Pain location: Mid/low back, right in the middle Pain description: burning, aching Aggravating factors: Standing, bending Relieving factors: Anti inflammatories, muscle relaxers offer temporary relief.   PRECAUTIONS: None  WEIGHT BEARING RESTRICTIONS No  FALLS:  Has patient fallen in last 6 months? 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  PLOF: Independent  PATIENT GOALS Establish HEP to help with the pain. Needs to limit her visits due to finances.   OBJECTIVE:   DIAGNOSTIC FINDINGS:  N/A  PATIENT SURVEYS:  Modified Oswestry    FOTO 29  SCREENING FOR RED FLAGS: Bowel or bladder incontinence: No Spinal tumors: No Cauda equina syndrome: No Compression fracture: No Abdominal aneurysm: No  COGNITION:  Overall cognitive status: Within functional limits for tasks assessed     SENSATION: Not tested  MUSCLE LENGTH: Hamstrings: Right 62 deg; Left 54 deg Thomas test: B hip flexor tightness noted.  POSTURE: right pelvic obliquity and R shoulder elevation, B pes  cavus  PALPATION: TTP along lower thoracic, upper lumbar paraspinals to just above illiac crests, B  LUMBAR ROM:   Active  A/PROM  eval  Flexion Mid shin  Extension WNL  Right lateral flexion To knee  Left lateral flexion 3" above knee  Right rotation WNL  Left rotation WNL   (Blank rows = not tested)  LOWER EXTREMITY ROM:   tight hamstrings and hip flexors, mildly diminished DF ROM  LOWER EXTREMITY MMT:  BLE strength (4+)-5/5 throughout, but trunk strength and stability are both diminished.  LUMBAR SPECIAL TESTS:  Slump test: Negative  FUNCTIONAL TESTS:  5 times sit to stand: 10.47 SLS- holds for > 10 sec on each limb  GAIT: Distance walked: 43' Assistive device utilized: None Level of assistance: Complete Independence Comments: No major gait deviations noted.    TODAY'S TREATMENT  10/08/21 NuStep L4 x6 min S2S OHP yellow ball 2x10 Rows & Lats 20lb 2x10 Lumbar Ext black 2x10  Supine bridges  LE on PBALL bridges, K2C, Oblq HS stretch 3x10'' each   Education   PATIENT EDUCATION:  Education details: POC, informal HEP for stretch Person educated: Patient Education method: Customer service manager Education comprehension: verbalized understanding   HOME EXERCISE PROGRAM: Verbal HEP to stretch HS, calves  ASSESSMENT:  CLINICAL IMPRESSION: Pt arrived feeling ok. Pt tolerated an initial progression to TE well evident by no subjective reports of increase pain. Postural cue required with standing shoulder extensions. Cue needed to engage core with seated rows. Some hip weakness noted with supine interventions. Some HS tightness.     OBJECTIVE IMPAIRMENTS decreased activity tolerance, decreased coordination, decreased ROM, decreased strength, impaired flexibility, postural dysfunction, and pain.   ACTIVITY LIMITATIONS carrying, lifting, bending, sleeping, and stairs  PARTICIPATION LIMITATIONS: cleaning, laundry, shopping, community activity, and  occupation  PERSONAL FACTORS 1 comorbidity: H/O scoliosis and kyphosis  are also affecting patient's functional outcome.   REHAB POTENTIAL: Good  CLINICAL DECISION MAKING: Stable/uncomplicated  EVALUATION COMPLEXITY: Low   GOALS: Goals reviewed with patient? Yes  SHORT TERM GOALS: Target date: 10/08/21  I with basic HEP Baseline: Goal status: INITIAL  LONG TERM GOALS: Target date: 12/03/2021  I with final HEP Baseline:  Goal status: INITIAL  2.  Increase FOTO score to at least 48 Baseline: 29 Goal status: INITIAL  3.  Decrease Owestry score to < 5 Baseline: 23 Goal status: INITIAL  4.  Patient will experience back pain of < 3/5 at any time after completing her full day of activities. Baseline:  Goal status: INITIAL   PLAN: PT FREQUENCY: 1x/week  PT DURATION: 8 weeks  PLANNED INTERVENTIONS: Therapeutic exercises, Therapeutic activity, Neuromuscular re-education, Balance training, Gait training, Patient/Family education, Self Care, Joint mobilization, Dry Needling, Electrical stimulation, Cryotherapy, Moist heat, Ionotophoresis '4mg'$ /ml Dexamethasone, and Manual therapy.  PLAN FOR NEXT SESSION: HEP for strength and stretch   Marcelina Morel, DPT 10/08/2021, 1:00 PM

## 2021-10-15 ENCOUNTER — Encounter: Payer: Self-pay | Admitting: Physical Therapy

## 2021-10-15 ENCOUNTER — Ambulatory Visit: Payer: 59 | Attending: Family Medicine | Admitting: Physical Therapy

## 2021-10-15 DIAGNOSIS — R293 Abnormal posture: Secondary | ICD-10-CM | POA: Insufficient documentation

## 2021-10-15 DIAGNOSIS — M4125 Other idiopathic scoliosis, thoracolumbar region: Secondary | ICD-10-CM | POA: Insufficient documentation

## 2021-10-15 DIAGNOSIS — M6281 Muscle weakness (generalized): Secondary | ICD-10-CM | POA: Diagnosis not present

## 2021-10-15 DIAGNOSIS — R278 Other lack of coordination: Secondary | ICD-10-CM | POA: Diagnosis present

## 2021-10-15 NOTE — Therapy (Signed)
OUTPATIENT PHYSICAL THERAPY THORACOLUMBAR TREATMENT   Patient Name: Karen Webb MRN: [id number] DOB:March 21, 2000, 21 y.o., female Today's Date: 10/15/2021   PT End of Session - 10/15/21 1508     Visit Number 3    Date for PT Re-Evaluation 11/19/21    PT Start Time 31    PT Stop Time 1542    PT Time Calculation (min) 39 min    Activity Tolerance Patient tolerated treatment well    Behavior During Therapy [facility] for tasks assessed/performed              Past Medical History:  Diagnosis Date   Allergy    Anxiety    Depression    GERD (gastroesophageal reflux disease)    Gluten-sensitive enteropathy    IBS (irritable bowel syndrome)    Kyphosis    slight   Past Surgical History:  Procedure Laterality Date   COLONOSCOPY  05/08/2021   UPPER GASTROINTESTINAL ENDOSCOPY  05/08/2021   WISDOM TOOTH EXTRACTION     Patient Active Problem List   Diagnosis Date Noted   Encounter for hepatitis C screening test for low risk patient 02/17/2021   Weight loss 02/17/2021   Hematochezia 02/17/2021   Depression, recurrent ([place]) 11/28/2020   Acute cough 11/28/2020   Acute non-recurrent maxillary sinusitis 11/28/2020    PCP: [name], MD   REFERRING PROVIDER: [name], MD   REFERRING DIAG: Diagnosis S39.012A (ICD-10-CM) - Strain of lumbar region, initial encounter   Rationale for Evaluation and Treatment Rehabilitation  THERAPY DIAG:  Muscle weakness (generalized)  Abnormal posture  Other lack of coordination  Other idiopathic scoliosis, thoracolumbar region  ONSET DATE: 09/10/2021   SUBJECTIVE:                                                                                                                                                                                           SUBJECTIVE STATEMENT: Patient reports that on Monday she leaned forward  while washing her hands. She felt a pop in her back and the pain became terrible. She had  to lie down for the rest of the day and it still bothers her.   PERTINENT HISTORY:  Back Pain Pertinent negatives include no abdominal pain, chest pain, tingling, weakness or weight loss.   for follow-up of depression and a month-long history of lower back pain.  Pain is located in the middle part of her back.  There is no radiation of pain.  There is no change in bowel or bladder function or saddle paresthesias.  There is no weakness.  Continues with 150 mg of Wellbutrin XL daily.  Disparate depression  and stress levels are elevated.  She is in her last semester of college.  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  She is also working 3 jobs.  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    PAIN:  Are you having pain? Yes: NPRS scale: 4/10 Pain location: Mid/low back, right in the middle Pain description: burning, aching Aggravating factors: Standing, bending Relieving factors: Anti inflammatories, muscle relaxers offer temporary relief.   PRECAUTIONS: None  WEIGHT BEARING RESTRICTIONS No  FALLS:  Has patient fallen in last 6 months? 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  PLOF: Independent  PATIENT GOALS Establish HEP to help with the pain. Needs to limit her visits due to finances.   OBJECTIVE:   DIAGNOSTIC FINDINGS:  N/A  PATIENT SURVEYS:  Modified Oswestry    FOTO 29  SCREENING FOR RED FLAGS: Bowel or bladder incontinence: No Spinal tumors: No Cauda equina syndrome: No Compression fracture: No Abdominal aneurysm: No  COGNITION:  Overall cognitive status: Within functional limits for tasks assessed     SENSATION: Not tested  MUSCLE  LENGTH: Hamstrings: Right 62 deg; Left 54 deg Thomas test: B hip flexor tightness noted.  POSTURE: right pelvic obliquity and R shoulder elevation, B pes cavus  PALPATION: TTP along lower thoracic, upper lumbar paraspinals to just above illiac crests, B  LUMBAR ROM:   Active  A/PROM  eval  Flexion Mid shin  Extension WNL  Right lateral flexion To knee  Left lateral flexion 3" above knee  Right rotation WNL  Left rotation WNL   (Blank rows = not tested)  LOWER EXTREMITY ROM:   tight hamstrings and hip flexors, mildly diminished DF ROM  LOWER EXTREMITY MMT:  BLE strength (4+)-5/5 throughout, but trunk strength and stability are both diminished.  LUMBAR SPECIAL TESTS:  Slump test: Negative  FUNCTIONAL TESTS:  5 times sit to stand: 10.47 SLS- holds for > 10 sec on each limb  GAIT: Distance walked: 21' Assistive device utilized: None Level of assistance: Complete Independence Comments: No major gait deviations noted.    TODAY'S TREATMENT  10/15/21 NuStep L4 x 6 minutes Supine stretch-HS, glut, tried LTR but she did not feel stretch Supine foam roller under thoracic spine- challenging for her Supine strength- PPT, marching bridge, clamshell with G tband Quadruped cat/cow to child's pose Seated HS stretch Doorway stretch Standing sh ext, rows, ER with G tband *All activities performed between 5-8 reps to ensure accurate performance for HEP  10/08/21 NuStep L4 x6 min S2S OHP yellow ball 2x10 Rows & Lats 20lb 2x10 Lumbar Ext black 2x10  Supine bridges  LE on PBALL bridges, K2C, Oblq HS stretch 3x10'' each   Education   PATIENT EDUCATION:  Education details: POC, informal HEP for stretch Person educated: Patient Education method: Explanation and Demonstration Education comprehension: verbalized understanding   HOME EXERCISE PROGRAM:  [id number]  ASSESSMENT:  CLINICAL IMPRESSION: Pt reports multiple incidents of simple activities causing increased back  pain, probably due to instability. Performed multiple new exercises for both strengthening and stretch to improve postural control and manage pain. She is quite weak in her trunk.    OBJECTIVE IMPAIRMENTS decreased activity tolerance, decreased  coordination, decreased ROM, decreased strength, impaired flexibility, postural dysfunction, and pain.   ACTIVITY LIMITATIONS carrying, lifting, bending, sleeping, and stairs  PARTICIPATION LIMITATIONS: cleaning, laundry, shopping, community activity, and occupation  PERSONAL FACTORS 1 comorbidity: H/O scoliosis and kyphosis  are also affecting patient's functional outcome.   REHAB POTENTIAL: Good  CLINICAL DECISION MAKING: Stable/uncomplicated  EVALUATION COMPLEXITY: Low   GOALS: Goals reviewed with patient? Yes  SHORT TERM GOALS: Target date: 10/08/21  I with basic HEP Baseline: Goal status: ongoing  LONG TERM GOALS: Target date: 12/03/2021  I with final HEP Baseline:  Goal status: INITIAL  2.  Increase FOTO score to at least 48 Baseline: 29 Goal status: INITIAL  3.  Decrease Owestry score to < 5 Baseline: 23 Goal status: INITIAL  4.  Patient will experience back pain of < 3/5 at any time after completing her full day of activities. Baseline:  Goal status: ongoing   PLAN: PT FREQUENCY: 1x/week  PT DURATION: 8 weeks  PLANNED INTERVENTIONS: Therapeutic exercises, Therapeutic activity, Neuromuscular re-education, Balance training, Gait training, Patient/Family education, Self Care, Joint mobilization, Dry Needling, Electrical stimulation, Cryotherapy, Moist heat, Ionotophoresis '4mg'$ /ml Dexamethasone, and Manual therapy.  PLAN FOR NEXT SESSION: HEP for strength and stretch   Marcelina Morel, DPT 10/15/2021, 3:52 PM

## 2021-10-22 ENCOUNTER — Ambulatory Visit: Payer: 59 | Admitting: Physical Therapy

## 2021-10-22 DIAGNOSIS — R293 Abnormal posture: Secondary | ICD-10-CM

## 2021-10-22 DIAGNOSIS — M6281 Muscle weakness (generalized): Secondary | ICD-10-CM

## 2021-10-22 DIAGNOSIS — R278 Other lack of coordination: Secondary | ICD-10-CM

## 2021-10-22 DIAGNOSIS — M4125 Other idiopathic scoliosis, thoracolumbar region: Secondary | ICD-10-CM

## 2021-10-22 NOTE — Therapy (Signed)
OUTPATIENT PHYSICAL THERAPY THORACOLUMBAR TREATMENT   Patient Name: Karen Webb MRN: [id number] DOB:01-14-2000, 21 y.o., female Today's Date: 10/22/2021   PT End of Session - 10/22/21 1325     Visit Number 4    Date for PT Re-Evaluation 11/19/21    PT Start Time 1320    PT Stop Time 1308    PT Time Calculation (min) 38 min    Activity Tolerance Patient tolerated treatment well    Behavior During Therapy WFL for tasks assessed/performed               Past Medical History:  Diagnosis Date   Allergy    Anxiety    Depression    GERD (gastroesophageal reflux disease)    Gluten-sensitive enteropathy    IBS (irritable bowel syndrome)    Kyphosis    slight   Past Surgical History:  Procedure Laterality Date   COLONOSCOPY  05/08/2021   UPPER GASTROINTESTINAL ENDOSCOPY  05/08/2021   WISDOM TOOTH EXTRACTION     Patient Active Problem List   Diagnosis Date Noted   Encounter for hepatitis C screening test for low risk patient 02/17/2021   Weight loss 02/17/2021   Hematochezia 02/17/2021   Depression, recurrent ([place]) 11/28/2020   Acute cough 11/28/2020   Acute non-recurrent maxillary sinusitis 11/28/2020    PCP: [name], MD   REFERRING PROVIDER: [name], MD   REFERRING DIAG: Diagnosis S39.012A (ICD-10-CM) - Strain of lumbar region, initial encounter   Rationale for Evaluation and Treatment Rehabilitation  THERAPY DIAG:  Muscle weakness (generalized)  Abnormal posture  Other lack of coordination  Other idiopathic scoliosis, thoracolumbar region  ONSET DATE: 09/10/2021   SUBJECTIVE:                                                                                                                                                                                           SUBJECTIVE STATEMENT: Patient reports no new issues. She became engaged over the weekend.  PERTINENT HISTORY:  Back Pain Pertinent negatives include no  abdominal pain, chest pain, tingling, weakness or weight loss.   for follow-up of depression and a month-long history of lower back pain.  Pain is located in the middle part of her back.  There is no radiation of pain.  There is no change in bowel or bladder function or saddle paresthesias.  There is no weakness.  Continues with 150 mg of Wellbutrin XL daily.  Disparate depression and stress levels are elevated.  She is in her last semester of college.  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  She is also working 3 jobs.  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    PAIN:  Are you having pain? Yes: NPRS scale: 4/10 Pain location: Mid/low back, right in the middle Pain description: burning, aching Aggravating factors: Standing, bending Relieving factors: Anti inflammatories, muscle relaxers offer temporary relief.   PRECAUTIONS: None  WEIGHT BEARING RESTRICTIONS No  FALLS:  Has patient fallen in last 6 months? 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  PLOF: Independent  PATIENT GOALS Establish HEP to help with the pain. Needs to limit her visits due to finances.   OBJECTIVE:   DIAGNOSTIC FINDINGS:  N/A  PATIENT SURVEYS:  Modified Oswestry    FOTO 29  SCREENING FOR RED FLAGS: Bowel or bladder incontinence: No Spinal tumors: No Cauda equina syndrome: No Compression fracture: No Abdominal aneurysm: No  COGNITION:  Overall cognitive status: Within functional limits for tasks assessed     SENSATION: Not tested  MUSCLE LENGTH: Hamstrings: Right 62 deg; Left 54 deg Thomas test: B hip flexor tightness noted.  POSTURE: right pelvic obliquity and R shoulder  elevation, B pes cavus  PALPATION: TTP along lower thoracic, upper lumbar paraspinals to just above illiac crests, B  LUMBAR ROM:   Active  A/PROM  eval  Flexion Mid shin  Extension WNL  Right lateral flexion To knee  Left lateral flexion 3" above knee  Right rotation WNL  Left rotation WNL   (Blank rows = not tested)  LOWER EXTREMITY ROM:   tight hamstrings and hip flexors, mildly diminished DF ROM  LOWER EXTREMITY MMT:  BLE strength (4+)-5/5 throughout, but trunk strength and stability are both diminished.  LUMBAR SPECIAL TESTS:  Slump test: Negative  FUNCTIONAL TESTS:  5 times sit to stand: 10.47 SLS- holds for > 10 sec on each limb  GAIT: Distance walked: 80' Assistive device utilized: None Level of assistance: Complete Independence Comments: No major gait deviations noted.    TODAY'S TREATMENT  10/22/21 Recumbent bike L3 x 6 minutes Supine PPT 5 x 5 Bridge over physioball x 10 -repeat with B hip IR x 10 -repeat with ball roll R/L, 2 x 5 reps Supine 90/90 marching with PPT, 5 x with each leg Supine ball press/crunch x 10 reps. Sidelying hip abd with slow forward and back swing, 10 reps each direction, B Sit to stand with chest press of 5# weight x 10 reps Repeat on Airex pad, with OHP of 5#, 10 reps  10/15/21 NuStep L4 x 6 minutes Supine stretch-HS, glut, tried LTR but she did not feel stretch Supine foam roller under thoracic spine- challenging for her Supine strength- PPT, marching bridge, clamshell with G tband Quadruped cat/cow to child's pose Seated HS stretch Doorway stretch Standing sh ext, rows, ER with G tband *All activities performed between 5-8 reps to ensure accurate performance for HEP  10/08/21 NuStep L4 x6 min S2S OHP yellow ball 2x10 Rows & Lats 20lb 2x10 Lumbar Ext black 2x10  Supine bridges  LE on PBALL bridges, K2C, Oblq HS stretch 3x10'' each   Education   PATIENT EDUCATION:  Education details: POC, informal HEP for  stretch Person educated: Patient Education method: Customer service manager Education comprehension: verbalized understanding   HOME EXERCISE  PROGRAM:  [id number]  ASSESSMENT:  CLINICAL IMPRESSION: Pt reports no new problems, feels she is getting better overall. Performed progressive strength and stabilization without reports of pain.    OBJECTIVE IMPAIRMENTS decreased activity tolerance, decreased coordination, decreased ROM, decreased strength, impaired flexibility, postural dysfunction, and pain.   ACTIVITY LIMITATIONS carrying, lifting, bending, sleeping, and stairs  PARTICIPATION LIMITATIONS: cleaning, laundry, shopping, community activity, and occupation  PERSONAL FACTORS 1 comorbidity: H/O scoliosis and kyphosis  are also affecting patient's functional outcome.   REHAB POTENTIAL: Good  CLINICAL DECISION MAKING: Stable/uncomplicated  EVALUATION COMPLEXITY: Low   GOALS: Goals reviewed with patient? Yes  SHORT TERM GOALS: Target date: 10/08/21  I with basic HEP Baseline: Goal status: ongoing  LONG TERM GOALS: Target date: 12/03/2021  I with final HEP Baseline:  Goal status: INITIAL  2.  Increase FOTO score to at least 48 Baseline: 29 Goal status: ongoing 3.  Decrease Owestry score to < 5 Baseline: 23 Goal status: ongoing  4.  Patient will experience back pain of < 3/5 at any time after completing her full day of activities. Baseline:  Goal status: ongoing   PLAN: PT FREQUENCY: 1x/week  PT DURATION: 8 weeks  PLANNED INTERVENTIONS: Therapeutic exercises, Therapeutic activity, Neuromuscular re-education, Balance training, Gait training, Patient/Family education, Self Care, Joint mobilization, Dry Needling, Electrical stimulation, Cryotherapy, Moist heat, Ionotophoresis '4mg'$ /ml Dexamethasone, and Manual therapy.  PLAN FOR NEXT SESSION: HEP for strength and stretch   Marcelina Morel, DPT 10/22/2021, 1:59 PM

## 2021-10-29 ENCOUNTER — Encounter: Payer: Self-pay | Admitting: Physical Therapy

## 2021-10-29 ENCOUNTER — Ambulatory Visit: Payer: 59 | Admitting: Physical Therapy

## 2021-10-29 DIAGNOSIS — M6281 Muscle weakness (generalized): Secondary | ICD-10-CM

## 2021-10-29 DIAGNOSIS — R278 Other lack of coordination: Secondary | ICD-10-CM

## 2021-10-29 DIAGNOSIS — R293 Abnormal posture: Secondary | ICD-10-CM

## 2021-10-29 DIAGNOSIS — M4125 Other idiopathic scoliosis, thoracolumbar region: Secondary | ICD-10-CM

## 2021-10-29 NOTE — Therapy (Signed)
OUTPATIENT PHYSICAL THERAPY THORACOLUMBAR TREATMENT   Patient Name: Karen Webb MRN: [id number] DOB:06-25-2000, 21 y.o., female Today's Date: 10/29/2021   PT End of Session - 10/29/21 1327     Visit Number 5    Date for PT Re-Evaluation 11/19/21    PT Start Time 1320    PT Stop Time 2671    PT Time Calculation (min) 38 min    Activity Tolerance Patient tolerated treatment well    Behavior During Therapy [facility] for tasks assessed/performed                Past Medical History:  Diagnosis Date   Allergy    Anxiety    Depression    GERD (gastroesophageal reflux disease)    Gluten-sensitive enteropathy    IBS (irritable bowel syndrome)    Kyphosis    slight   Past Surgical History:  Procedure Laterality Date   COLONOSCOPY  05/08/2021   UPPER GASTROINTESTINAL ENDOSCOPY  05/08/2021   WISDOM TOOTH EXTRACTION     Patient Active Problem List   Diagnosis Date Noted   Encounter for hepatitis C screening test for low risk patient 02/17/2021   Weight loss 02/17/2021   Hematochezia 02/17/2021   Depression, recurrent ([place]) 11/28/2020   Acute cough 11/28/2020   Acute non-recurrent maxillary sinusitis 11/28/2020    PCP: [name], MD   REFERRING PROVIDER: [name], MD   REFERRING DIAG: Diagnosis S39.012A (ICD-10-CM) - Strain of lumbar region, initial encounter   Rationale for Evaluation and Treatment Rehabilitation  THERAPY DIAG:  Muscle weakness (generalized)  Abnormal posture  Other lack of coordination  Other idiopathic scoliosis, thoracolumbar region  ONSET DATE: 09/10/2021   SUBJECTIVE:                                                                                                                                                                                           SUBJECTIVE STATEMENT: Patient reports no new issues. She has had a busy week. Has been trying some new medications to treat her anxiety and depression. She has  a lot of stress with school work. She reports very tight and painful traps.  PERTINENT HISTORY:  Back Pain Pertinent negatives include no abdominal pain, chest pain, tingling, weakness or weight loss.   for follow-up of depression and a month-long history of lower back pain.  Pain is located in the middle part of her back.  There is no radiation of pain.  There is no change in bowel or bladder function or saddle paresthesias.  There is no weakness.  Continues with 150 mg of Wellbutrin XL daily.  Disparate depression and  stress levels are elevated.  She is in her last semester of college.  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  She is also working 3 jobs.  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    PAIN:  Are you having pain? Yes: NPRS scale: 4/10 Pain location: Mid/low back, right in the middle Pain description: burning, aching Aggravating factors: Standing, bending Relieving factors: Anti inflammatories, muscle relaxers offer temporary relief.   PRECAUTIONS: None  WEIGHT BEARING RESTRICTIONS No  FALLS:  Has patient fallen in last 6 months? 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  PLOF: Independent  PATIENT GOALS Establish HEP to help with the pain. Needs to limit her visits due to finances.   OBJECTIVE:   DIAGNOSTIC FINDINGS:  N/A  PATIENT SURVEYS:  Modified Oswestry    FOTO 29  SCREENING FOR RED FLAGS: Bowel or bladder incontinence: No Spinal tumors: No Cauda equina syndrome: No Compression fracture: No Abdominal aneurysm: No  COGNITION:  Overall cognitive status: Within functional limits for tasks assessed     SENSATION: Not tested  MUSCLE  LENGTH: Hamstrings: Right 62 deg; Left 54 deg Thomas test: B hip flexor tightness noted.  POSTURE: right pelvic obliquity and R shoulder elevation, B pes cavus  PALPATION: TTP along lower thoracic, upper lumbar paraspinals to just above illiac crests, B  LUMBAR ROM:   Active  A/PROM  eval  Flexion Mid shin  Extension WNL  Right lateral flexion To knee  Left lateral flexion 3" above knee  Right rotation WNL  Left rotation WNL   (Blank rows = not tested)  LOWER EXTREMITY ROM:   tight hamstrings and hip flexors, mildly diminished DF ROM  LOWER EXTREMITY MMT:  BLE strength (4+)-5/5 throughout, but trunk strength and stability are both diminished.  LUMBAR SPECIAL TESTS:  Slump test: Negative  FUNCTIONAL TESTS:  5 times sit to stand: 10.47 SLS- holds for > 10 sec on each limb  GAIT: Distance walked: 52' Assistive device utilized: None Level of assistance: Complete Independence Comments: No major gait deviations noted.    TODAY'S TREATMENT  10/29/21 NuStep L5 x 5 minutes. MH to neck and shoulders x 10 minutes Shoulder circles 10 reps each direction, Chin tucks in supine Seated up traps stretch, lower neck stretch, shoulder retraction Theragun/STM to B upper traps, with stretch   10/22/21 Recumbent bike L3 x 6 minutes Supine PPT 5 x 5 Bridge over physioball x 10 -repeat with B hip IR x 10 -repeat with ball roll R/L, 2 x 5 reps Supine 90/90 marching with PPT, 5 x with each leg Supine ball press/crunch x 10 reps. Sidelying hip abd with slow forward and back swing, 10 reps each direction, B Sit to stand with chest press of 5# weight x 10 reps Repeat on Airex pad, with OHP of 5#, 10 reps  10/15/21 NuStep L4 x 6 minutes Supine stretch-HS, glut, tried LTR but she did not feel stretch Supine foam roller under thoracic spine- challenging for her Supine strength- PPT, marching bridge, clamshell with G tband Quadruped cat/cow to child's pose Seated HS stretch Doorway  stretch Standing sh ext, rows, ER with G tband *All activities performed between 5-8  reps to ensure accurate performance for HEP  10/08/21 NuStep L4 x6 min S2S OHP yellow ball 2x10 Rows & Lats 20lb 2x10 Lumbar Ext black 2x10  Supine bridges  LE on PBALL bridges, K2C, Oblq HS stretch 3x10'' each   Education   PATIENT EDUCATION:  Education details: POC, informal HEP for stretch Person educated: Patient Education method: Explanation and Demonstration Education comprehension: verbalized understanding   HOME EXERCISE PROGRAM:  [id number]  ASSESSMENT:  CLINICAL IMPRESSION: Pt reports  that her low back pain is improved. However, she is under a lot of stress and C/O severe shoulder tightness and pain. Treatment focused on her upper trunk STM to go along with the strength exer she has. She reported some relief after treatment.   OBJECTIVE IMPAIRMENTS decreased activity tolerance, decreased coordination, decreased ROM, decreased strength, impaired flexibility, postural dysfunction, and pain.   ACTIVITY LIMITATIONS carrying, lifting, bending, sleeping, and stairs  PARTICIPATION LIMITATIONS: cleaning, laundry, shopping, community activity, and occupation  PERSONAL FACTORS 1 comorbidity: H/O scoliosis and kyphosis  are also affecting patient's functional outcome.   REHAB POTENTIAL: Good  CLINICAL DECISION MAKING: Stable/uncomplicated  EVALUATION COMPLEXITY: Low   GOALS: Goals reviewed with patient? Yes  SHORT TERM GOALS: Target date: 10/08/21  I with basic HEP Baseline: Goal status: ongoing  LONG TERM GOALS: Target date: 12/03/2021  I with final HEP Baseline:  Goal status: INITIAL  2.  Increase FOTO score to at least 48 Baseline: 29 Goal status: ongoing 3.  Decrease Owestry score to < 5 Baseline: 23 Goal status: ongoing  4.  Patient will experience back pain of < 3/5 at any time after completing her full day of activities. Baseline:  Goal status:  ongoing   PLAN: PT FREQUENCY: 1x/week  PT DURATION: 8 weeks  PLANNED INTERVENTIONS: Therapeutic exercises, Therapeutic activity, Neuromuscular re-education, Balance training, Gait training, Patient/Family education, Self Care, Joint mobilization, Dry Needling, Electrical stimulation, Cryotherapy, Moist heat, Ionotophoresis '4mg'$ /ml Dexamethasone, and Manual therapy.  PLAN FOR NEXT SESSION: HEP for strength and stretch   Marcelina Morel, DPT 10/29/2021, 2:11 PM

## 2021-11-04 ENCOUNTER — Ambulatory Visit (HOSPITAL_COMMUNITY): Payer: 59 | Admitting: Psychiatry

## 2021-11-06 ENCOUNTER — Ambulatory Visit: Payer: 59 | Admitting: Physical Therapy

## 2021-11-08 ENCOUNTER — Other Ambulatory Visit: Payer: Self-pay | Admitting: Family Medicine

## 2021-11-08 DIAGNOSIS — F339 Major depressive disorder, recurrent, unspecified: Secondary | ICD-10-CM

## 2021-11-09 ENCOUNTER — Other Ambulatory Visit: Payer: Self-pay | Admitting: Family Medicine

## 2021-11-09 DIAGNOSIS — S39012A Strain of muscle, fascia and tendon of lower back, initial encounter: Secondary | ICD-10-CM

## 2021-11-12 ENCOUNTER — Ambulatory Visit: Payer: 59 | Admitting: Physical Therapy

## 2021-11-19 ENCOUNTER — Ambulatory Visit: Payer: 59 | Attending: Family Medicine | Admitting: Physical Therapy

## 2021-11-19 DIAGNOSIS — M6281 Muscle weakness (generalized): Secondary | ICD-10-CM | POA: Diagnosis not present

## 2021-11-19 DIAGNOSIS — R293 Abnormal posture: Secondary | ICD-10-CM | POA: Diagnosis present

## 2021-11-19 DIAGNOSIS — M4125 Other idiopathic scoliosis, thoracolumbar region: Secondary | ICD-10-CM | POA: Insufficient documentation

## 2021-11-19 DIAGNOSIS — R278 Other lack of coordination: Secondary | ICD-10-CM | POA: Diagnosis present

## 2021-11-19 NOTE — Therapy (Signed)
OUTPATIENT PHYSICAL THERAPY THORACOLUMBAR TREATMENT   Patient Name: Karen Webb MRN: [id number] DOB:[birth year], 21 y.o., female Today's Date: 11/19/2021   PT End of Session - 11/19/21 1801     Date for PT Re-Evaluation 12/17/21                Past Medical History:  Diagnosis Date   Allergy    Anxiety    Depression    GERD (gastroesophageal reflux disease)    Gluten-sensitive enteropathy    IBS (irritable bowel syndrome)    Kyphosis    slight   Past Surgical History:  Procedure Laterality Date   COLONOSCOPY  05/08/2021   UPPER GASTROINTESTINAL ENDOSCOPY  05/08/2021   WISDOM TOOTH EXTRACTION     Patient Active Problem List   Diagnosis Date Noted   Encounter for hepatitis C screening test for low risk patient 02/17/2021   Weight loss 02/17/2021   Hematochezia 02/17/2021   Depression, recurrent ([place]) 11/28/2020   Acute cough 11/28/2020   Acute non-recurrent maxillary sinusitis 11/28/2020    PCP: [name], MD   REFERRING PROVIDER: [name], MD   REFERRING DIAG: Diagnosis S39.012A (ICD-10-CM) - Strain of lumbar region, initial encounter   Rationale for Evaluation and Treatment Rehabilitation  THERAPY DIAG:  Muscle weakness (generalized)  Abnormal posture  Other lack of coordination  Other idiopathic scoliosis, thoracolumbar region  ONSET DATE: 09/10/2021   SUBJECTIVE:                                                                                                                                                                                           SUBJECTIVE STATEMENT: Patient reports that her life has been very busy and stressful, but she is working through them. Her back pain is improved except when she has been walking around with her backpack on for a while. Her hips are sore at night if she is lying on her side. It appears to be due to pressure directly over her hip bones.  PERTINENT HISTORY:  Back  Pain Pertinent negatives include no abdominal pain, chest pain, tingling, weakness or weight loss.   for follow-up of depression and a month-long history of lower back pain.  Pain is located in the middle part of her back.  There is no radiation of pain.  There is no change in bowel or bladder function or saddle paresthesias.  There is no weakness.  Continues with 150 mg of Wellbutrin XL daily.  Disparate depression and stress levels are elevated.  She is in her last semester of college.  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  She is also  working 3 jobs.  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    PAIN:  Are you having pain? Yes: NPRS scale: 4/10 Pain location: Mid/low back, right in the middle Pain description: burning, aching Aggravating factors: Standing, bending Relieving factors: Anti inflammatories, muscle relaxers offer temporary relief.   PRECAUTIONS: None  WEIGHT BEARING RESTRICTIONS No  FALLS:  Has patient fallen in last 6 months? 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  PLOF: Independent  PATIENT GOALS Establish HEP to help with the pain. Needs to limit her visits due to finances.   OBJECTIVE:   DIAGNOSTIC FINDINGS:  N/A  PATIENT SURVEYS:  Modified Oswestry    FOTO 29  SCREENING FOR RED FLAGS: Bowel or bladder incontinence: No Spinal tumors: No Cauda equina syndrome: No Compression fracture: No Abdominal aneurysm: No  COGNITION:  Overall cognitive status: Within functional limits for tasks assessed     SENSATION: Not tested  MUSCLE LENGTH: Hamstrings: Right 62 deg; Left 54 deg Thomas test: B hip flexor tightness noted.  POSTURE:  right pelvic obliquity and R shoulder elevation, B pes cavus  PALPATION: TTP along lower thoracic, upper lumbar paraspinals to just above illiac crests, B  LUMBAR ROM:   Active  A/PROM  eval  Flexion Mid shin  Extension WNL  Right lateral flexion To knee  Left lateral flexion 3" above knee  Right rotation WNL  Left rotation WNL   (Blank rows = not tested)  LOWER EXTREMITY ROM:   tight hamstrings and hip flexors, mildly diminished DF ROM  LOWER EXTREMITY MMT:  BLE strength (4+)-5/5 throughout, but trunk strength and stability are both diminished.  LUMBAR SPECIAL TESTS:  Slump test: Negative  FUNCTIONAL TESTS:  5 times sit to stand: 10.47 SLS- holds for > 10 sec on each limb  GAIT: Distance walked: 14' Assistive device utilized: None Level of assistance: Complete Independence Comments: No major gait deviations noted.    TODAY'S TREATMENT  11/19/21 NuStep L5 x 6 minutes Functional status re-assessed for UPOC, see goals Standing rows, Sh ext, ER against G tband resistance, 2 x 10 of each  10/29/21 NuStep L5 x 5 minutes. MH to neck and shoulders x 10 minutes Shoulder circles 10 reps each direction, Chin tucks in supine Seated up traps stretch, lower neck stretch, shoulder retraction Theragun/STM to B upper traps, with stretch   10/22/21 Recumbent bike L3 x 6 minutes Supine PPT 5 x 5 Bridge over physioball x 10 -repeat with B hip IR x 10 -repeat with ball roll R/L, 2 x 5 reps Supine 90/90 marching with PPT, 5 x with each leg Supine ball press/crunch x 10 reps. Sidelying hip abd with slow forward and back swing, 10 reps each direction, B Sit to stand with chest press of 5# weight x 10 reps Repeat on Airex pad, with OHP of 5#, 10 reps  10/15/21 NuStep L4 x 6 minutes Supine stretch-HS, glut, tried LTR but she did not feel stretch Supine foam roller under thoracic spine- challenging for her Supine strength- PPT, marching bridge, clamshell with G tband Quadruped  cat/cow to child's pose Seated HS stretch Doorway stretch Standing sh ext, rows, ER with G tband *All activities performed  between 5-8 reps to ensure accurate performance for HEP  10/08/21 NuStep L4 x6 min S2S OHP yellow ball 2x10 Rows & Lats 20lb 2x10 Lumbar Ext black 2x10  Supine bridges  LE on PBALL bridges, K2C, Oblq HS stretch 3x10'' each   Education   PATIENT EDUCATION:  Education details: POC, informal HEP for stretch Person educated: Patient Education method: Explanation and Demonstration Education comprehension: verbalized understanding   HOME EXERCISE PROGRAM:  [id number]  ASSESSMENT:  CLINICAL IMPRESSION: Pt reports  continued improvement. Her back still has pain after carrying her backpack for a period of time. Functional status re-assessed for UPOC. She demonstrates excellent progress toward LTG. Plan to spread out her visits over the next several weeks. She will perform her HEP in between visits and treatment will assess and update as appropriate.   OBJECTIVE IMPAIRMENTS decreased activity tolerance, decreased coordination, decreased ROM, decreased strength, impaired flexibility, postural dysfunction, and pain.   ACTIVITY LIMITATIONS carrying, lifting, bending, sleeping, and stairs  PARTICIPATION LIMITATIONS: cleaning, laundry, shopping, community activity, and occupation  PERSONAL FACTORS 1 comorbidity: H/O scoliosis and kyphosis  are also affecting patient's functional outcome.   REHAB POTENTIAL: Good  CLINICAL DECISION MAKING: Stable/uncomplicated  EVALUATION COMPLEXITY: Low   GOALS: Goals reviewed with patient? Yes  SHORT TERM GOALS: Target date: 10/08/21  I with basic HEP Baseline: Goal status: ongoing  LONG TERM GOALS: Target date: 12/17/2021  I with final HEP Baseline:  Goal status: ongoing  2.  Increase FOTO score to at least 48 Baseline: 47, 11/19/21 Goal status: ongoing 3.  Decrease Owestry score to < 5 Baseline: 8-11/19/21 Goal  status: ongoing  4.  Patient will experience back pain of < 3/10 at any time after completing her full day of activities. Baseline: 11/19/21-7/10 Goal status: ongoing   PLAN: PT FREQUENCY: 1x/week  PT DURATION: 8 weeks  PLANNED INTERVENTIONS: Therapeutic exercises, Therapeutic activity, Neuromuscular re-education, Balance training, Gait training, Patient/Family education, Self Care, Joint mobilization, Dry Needling, Electrical stimulation, Cryotherapy, Moist heat, Ionotophoresis '4mg'$ /ml Dexamethasone, and Manual therapy.  PLAN FOR NEXT SESSION: HEP for strength and stretch   Marcelina Morel, DPT 11/19/2021, 6:01 PM

## 2021-11-21 ENCOUNTER — Other Ambulatory Visit: Payer: Self-pay | Admitting: Family Medicine

## 2021-11-26 ENCOUNTER — Encounter: Payer: Self-pay | Admitting: Family Medicine

## 2021-11-26 ENCOUNTER — Ambulatory Visit: Payer: 59 | Admitting: Physical Therapy

## 2021-11-28 ENCOUNTER — Encounter: Payer: Self-pay | Admitting: Family Medicine

## 2021-11-28 ENCOUNTER — Ambulatory Visit (INDEPENDENT_AMBULATORY_CARE_PROVIDER_SITE_OTHER): Payer: 59 | Admitting: Family Medicine

## 2021-11-28 VITALS — BP 120/74 | HR 91 | Temp 98.4°F | Ht 67.0 in | Wt 185.4 lb

## 2021-11-28 DIAGNOSIS — S39012A Strain of muscle, fascia and tendon of lower back, initial encounter: Secondary | ICD-10-CM

## 2021-11-28 DIAGNOSIS — F339 Major depressive disorder, recurrent, unspecified: Secondary | ICD-10-CM

## 2021-11-28 MED ORDER — ESCITALOPRAM OXALATE 10 MG PO TABS: 10.0000 mg | ORAL_TABLET | Freq: Every day | ORAL | 2 refills | Status: DC

## 2021-11-28 MED ORDER — BUPROPION HCL ER (XL) 300 MG PO TB24: 300.0000 mg | ORAL_TABLET | Freq: Every day | ORAL | 2 refills | Status: DC

## 2021-11-28 NOTE — Progress Notes (Signed)
Established Patient Office Visit  Subjective   Patient ID: Naelani Lafrance, female    DOB: 2000-01-25  Age: 21 y.o. MRN: [id number]  Chief Complaint  Patient presents with   Follow-up    Follow up on medication. No concerns. Patient fasting.     HPI follow-up of depression, low back pain and vaping.  Depression is under much better control.  Actually feels happy with an elevated mood.  Psychiatrist increased the bupropion to 300 mg.  She continues with 10 mg of Lexapro.  She will not be following up with the psychiatrist.  Almost done with college.  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  Physical therapy is going great for her back.  There is little to no pain she has almost finished.  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  She does not vape otherwise.  She is engaged!    Review of Systems  Constitutional: Negative.   HENT: Negative.    Eyes:  Negative for blurred vision, discharge and redness.  Respiratory: Negative.    Cardiovascular: Negative.   Gastrointestinal:  Negative for abdominal pain.  Genitourinary: Negative.   Musculoskeletal: Negative.  Negative for myalgias.  Skin:  Negative for rash.  Neurological:  Negative for tingling, loss of consciousness and weakness.  Endo/Heme/Allergies:  Negative for polydipsia.      Objective:     BP 120/74 (BP Location: Right Arm, Patient Position: Sitting, Cuff Size: Large)   Pulse 91   Temp 98.4 F (36.9 C) (Temporal)   Ht '5\' 7"'$  (1.702 m)   Wt 185 lb 6.4 oz (84.1 kg)   SpO2 99%   BMI 29.04 kg/m    Physical Exam Constitutional:      General: She is not in acute distress.    Appearance: Normal appearance. She is not ill-appearing, toxic-appearing or diaphoretic.  HENT:     Head: Normocephalic and atraumatic.     Right Ear: External ear normal.     Left Ear: External ear normal.  Eyes:     General: No scleral icterus.       Right eye: No discharge.         Left eye: No discharge.     Extraocular Movements: Extraocular movements intact.     Conjunctiva/sclera: Conjunctivae normal.  Pulmonary:     Effort: Pulmonary effort is normal. No respiratory distress.     Breath sounds: Normal breath sounds.  Skin:    General: Skin is warm and dry.  Neurological:     Mental Status: She is alert and oriented to person, place, and time.  Psychiatric:        Mood and Affect: Mood normal.        Behavior: Behavior normal.      No results found for any visits on 11/28/21.    The ASCVD Risk score (Arnett DK, et al., 2019) failed to calculate for the following reasons:   The 2019 ASCVD risk score is only valid for ages 77 to 101    Assessment & Plan:   Problem List Items Addressed This Visit       Other   Depression, recurrent ([place]) - Primary   Relevant Medications   escitalopram (LEXAPRO) 10 MG tablet   buPROPion (WELLBUTRIN XL) 300 MG 24 hr tablet   Other Visit Diagnoses     Strain of lumbar region, initial encounter           Return in  about 6 months (around 05/29/2022), or if symptoms worsen or fail to improve.   Continue Lexapro and bupropion.  Follow-up in 6 months. [name], MD

## 2021-12-03 ENCOUNTER — Encounter: Payer: Self-pay | Admitting: Physical Therapy

## 2021-12-03 ENCOUNTER — Ambulatory Visit: Payer: 59 | Admitting: Physical Therapy

## 2021-12-03 DIAGNOSIS — R293 Abnormal posture: Secondary | ICD-10-CM

## 2021-12-03 DIAGNOSIS — R278 Other lack of coordination: Secondary | ICD-10-CM

## 2021-12-03 DIAGNOSIS — M4125 Other idiopathic scoliosis, thoracolumbar region: Secondary | ICD-10-CM

## 2021-12-03 DIAGNOSIS — M6281 Muscle weakness (generalized): Secondary | ICD-10-CM | POA: Diagnosis not present

## 2021-12-03 NOTE — Therapy (Signed)
OUTPATIENT PHYSICAL THERAPY THORACOLUMBAR TREATMENT   Patient Name: Karen Webb MRN: [id number] DOB:2000-02-25, 21 y.o., female Today's Date: 12/03/2021   PT End of Session - 12/03/21 1733     Visit Number 7    Date for PT Re-Evaluation 12/17/21    PT Start Time 5208    PT Stop Time 1758    PT Time Calculation (min) 27 min    Activity Tolerance Patient tolerated treatment well    Behavior During Therapy [facility] for tasks assessed/performed           PHYSICAL THERAPY DISCHARGE SUMMARY  Visits from Start of Care: 7  Current functional level related to goals / functional outcomes: Much improved with back pain rarely reaching a 4/10.   Remaining deficits: One LTG partially met   Education / Equipment: HEP   Patient agrees to discharge. Patient goals were partially met. Patient is being discharged due to being pleased with the current functional level.   Past Medical History:  Diagnosis Date   Allergy    Anxiety    Depression    GERD (gastroesophageal reflux disease)    Gluten-sensitive enteropathy    IBS (irritable bowel syndrome)    Kyphosis    slight   Past Surgical History:  Procedure Laterality Date   COLONOSCOPY  05/08/2021   UPPER GASTROINTESTINAL ENDOSCOPY  05/08/2021   WISDOM TOOTH EXTRACTION     Patient Active Problem List   Diagnosis Date Noted   Encounter for hepatitis C screening test for low risk patient 02/17/2021   Weight loss 02/17/2021   Hematochezia 02/17/2021   Depression, recurrent ([place]) 11/28/2020   Acute cough 11/28/2020   Acute non-recurrent maxillary sinusitis 11/28/2020    PCP: [name], MD   REFERRING PROVIDER: [name], MD   REFERRING DIAG: Diagnosis S39.012A (ICD-10-CM) - Strain of lumbar region, initial encounter   Rationale for Evaluation and Treatment Rehabilitation  THERAPY DIAG:  Muscle weakness (generalized)  Abnormal posture  Other lack of coordination  Other idiopathic  scoliosis, thoracolumbar region  ONSET DATE: 09/10/2021   SUBJECTIVE:                                                                                                                                                                                           SUBJECTIVE STATEMENT: Patient arrived late today, thought her appointment was 5:30. Her pain is minimal. She was performing HEP until the last few days due to finals. She plans to try to perform it in the mornings.  PERTINENT HISTORY:  Back Pain Pertinent negatives include no abdominal pain, chest pain, tingling, weakness or weight loss.  for follow-up of depression and a month-long history of lower back pain.  Pain is located in the middle part of her back.  There is no radiation of pain.  There is no change in bowel or bladder function or saddle paresthesias.  There is no weakness.  Continues with 150 mg of Wellbutrin XL daily.  Disparate depression and stress levels are elevated.  She is in her last semester of college.  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  She is also working 3 jobs.  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    PAIN:  Are you having pain? Yes: NPRS scale: 4/10 Pain location: Mid/low back, right in the middle Pain description: burning, aching Aggravating factors: Standing, bending Relieving factors: Anti inflammatories, muscle relaxers offer temporary relief.   PRECAUTIONS: None  WEIGHT BEARING RESTRICTIONS No  FALLS:  Has patient fallen in last 6 months? 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  PLOF: Independent  PATIENT GOALS Establish HEP to help with the pain. Needs to limit her visits  due to finances.   OBJECTIVE:   DIAGNOSTIC FINDINGS:  N/A  PATIENT SURVEYS:  Modified Oswestry    FOTO 29  SCREENING FOR RED FLAGS: Bowel or bladder incontinence: No Spinal tumors: No Cauda equina syndrome: No Compression fracture: No Abdominal aneurysm: No  COGNITION:  Overall cognitive status: Within functional limits for tasks assessed     SENSATION: Not tested  MUSCLE LENGTH: Hamstrings: Right 62 deg; Left 54 deg Thomas test: B hip flexor tightness noted.  POSTURE: right pelvic obliquity and R shoulder elevation, B pes cavus  PALPATION: TTP along lower thoracic, upper lumbar paraspinals to just above illiac crests, B  LUMBAR ROM:   Active  A/PROM  eval  Flexion Mid shin  Extension WNL  Right lateral flexion To knee  Left lateral flexion 3" above knee  Right rotation WNL  Left rotation WNL   (Blank rows = not tested)  LOWER EXTREMITY ROM:   tight hamstrings and hip flexors, mildly diminished DF ROM  LOWER EXTREMITY MMT:  BLE strength (4+)-5/5 throughout, but trunk strength and stability are both diminished.  LUMBAR SPECIAL TESTS:  Slump test: Negative  FUNCTIONAL TESTS:  5 times sit to stand: 10.47 SLS- holds for > 10 sec on each limb  GAIT: Distance walked: 25' Assistive device utilized: None Level of assistance: Complete Independence Comments: No major gait deviations noted.    TODAY'S TREATMENT  12/03/21 NuStep L5 5 minutes Re-assessed for D/C HEP updated to include bridging, bridge with abd resistance, standing side step against resistance, and heel raises. Patient returned demo of each exercise.  11/19/21 NuStep L5 x 6 minutes Functional status re-assessed for UPOC, see goals Standing rows, Sh ext, ER against G tband resistance, 2 x 10 of each  10/29/21 NuStep L5 x 5 minutes. MH to neck and shoulders x 10 minutes Shoulder circles 10 reps each direction, Chin tucks in supine Seated up traps stretch, lower neck stretch, shoulder  retraction Theragun/STM to B upper traps, with stretch   10/22/21 Recumbent bike L3 x 6 minutes Supine PPT 5 x 5 Bridge over physioball x 10 -repeat with B hip IR x  10 -repeat with ball roll R/L, 2 x 5 reps Supine 90/90 marching with PPT, 5 x with each leg Supine ball press/crunch x 10 reps. Sidelying hip abd with slow forward and back swing, 10 reps each direction, B Sit to stand with chest press of 5# weight x 10 reps Repeat on Airex pad, with OHP of 5#, 10 reps  10/15/21 NuStep L4 x 6 minutes Supine stretch-HS, glut, tried LTR but she did not feel stretch Supine foam roller under thoracic spine- challenging for her Supine strength- PPT, marching bridge, clamshell with G tband Quadruped cat/cow to child's pose Seated HS stretch Doorway stretch Standing sh ext, rows, ER with G tband *All activities performed between 5-8 reps to ensure accurate performance for HEP  10/08/21 NuStep L4 x6 min S2S OHP yellow ball 2x10 Rows & Lats 20lb 2x10 Lumbar Ext black 2x10  Supine bridges  LE on PBALL bridges, K2C, Oblq HS stretch 3x10'' each   Education   PATIENT EDUCATION:  Education details: POC, informal HEP for stretch Person educated: Patient Education method: Explanation and Demonstration Education comprehension: verbalized understanding   HOME EXERCISE PROGRAM:  [id number]  ASSESSMENT:  CLINICAL IMPRESSION: Pt reports  continued improvement. Her back pain is well controlled and she feels confident that her HEP provides her exercises to continue to improve. She has been performing the program consistently except the past few days due to exams.    OBJECTIVE IMPAIRMENTS decreased activity tolerance, decreased coordination, decreased ROM, decreased strength, impaired flexibility, postural dysfunction, and pain.   ACTIVITY LIMITATIONS carrying, lifting, bending, sleeping, and stairs  PARTICIPATION LIMITATIONS: cleaning, laundry, shopping, community activity, and  occupation  PERSONAL FACTORS 1 comorbidity: H/O scoliosis and kyphosis  are also affecting patient's functional outcome.   REHAB POTENTIAL: Good  CLINICAL DECISION MAKING: Stable/uncomplicated  EVALUATION COMPLEXITY: Low   GOALS: Goals reviewed with patient? Yes  SHORT TERM GOALS: Target date: 10/08/21  I with basic HEP Baseline: Goal status: ongoing  LONG TERM GOALS: Target date: 12/17/2021  I with final HEP Baseline:  Goal status: met  2.  Increase FOTO score to at least 48 Baseline: 47, 11/19/21, 12/03/21-74 Goal status: met 3.  Decrease Owestry score to < 5 Baseline: 8-11/19/21 Goal status: met  4.  Patient will experience back pain of < 3/10 at any time after completing her full day of activities. Baseline: 11/19/21-7/10, 12/03/21- Max of 3 at any time Goal status: partially met   PLAN: PT FREQUENCY: 1x/week  PT DURATION: 8 weeks  PLANNED INTERVENTIONS: Therapeutic exercises, Therapeutic activity, Neuromuscular re-education, Balance training, Gait training, Patient/Family education, Self Care, Joint mobilization, Dry Needling, Electrical stimulation, Cryotherapy, Moist heat, Ionotophoresis 47m/ml Dexamethasone, and Manual therapy.  PLAN FOR NEXT SESSION: HEP for strength and stretch   SMarcelina Morel DPT 12/03/2021, 5:59 PM

## 2021-12-05 ENCOUNTER — Encounter: Payer: Self-pay | Admitting: Family Medicine

## 2021-12-09 ENCOUNTER — Encounter: Payer: Self-pay | Admitting: Family Medicine

## 2021-12-09 ENCOUNTER — Ambulatory Visit (INDEPENDENT_AMBULATORY_CARE_PROVIDER_SITE_OTHER): Payer: 59 | Admitting: Family Medicine

## 2021-12-09 VITALS — BP 130/86 | HR 116 | Temp 97.0°F | Ht 67.0 in | Wt 187.6 lb

## 2021-12-09 DIAGNOSIS — L819 Disorder of pigmentation, unspecified: Secondary | ICD-10-CM

## 2021-12-09 NOTE — Progress Notes (Signed)
   Established Patient Office Visit  Subjective   Patient ID: Karen Webb, female    DOB: August 10, 2000  Age: 21 y.o. MRN: [id number]  Chief Complaint  Patient presents with   Acute Visit    Assess a mole for potential removal left ring finger.    HPI for evaluation of a mole on the dorsum of her right fourth finger.  Recently engaged and is questioning whether or not the ring is irritating the mole.  Mole has perhaps enlarged but otherwise stable.  He has been present for years.    Review of Systems  Constitutional: Negative.   HENT: Negative.    Eyes:  Negative for blurred vision, discharge and redness.  Respiratory: Negative.    Cardiovascular: Negative.   Gastrointestinal:  Negative for abdominal pain.  Genitourinary: Negative.   Musculoskeletal: Negative.  Negative for myalgias.  Skin:  Negative for rash.  Neurological:  Negative for tingling, loss of consciousness and weakness.  Endo/Heme/Allergies:  Negative for polydipsia.      Objective:     BP 130/86 (BP Location: Left Arm, Patient Position: Sitting, Cuff Size: Normal)   Pulse (!) 116   Temp (!) 97 F (36.1 C) (Temporal)   Ht '5\' 7"'$  (1.702 m)   Wt 187 lb 9.6 oz (85.1 kg)   SpO2 98%   BMI 29.38 kg/m    Physical Exam Constitutional:      General: She is not in acute distress.    Appearance: Normal appearance. She is not ill-appearing, toxic-appearing or diaphoretic.  HENT:     Head: Normocephalic and atraumatic.     Right Ear: External ear normal.     Left Ear: External ear normal.  Eyes:     General: No scleral icterus.       Right eye: No discharge.        Left eye: No discharge.     Extraocular Movements: Extraocular movements intact.     Conjunctiva/sclera: Conjunctivae normal.  Pulmonary:     Effort: Pulmonary effort is normal. No respiratory distress.  Skin:    General: Skin is warm and dry.       Neurological:     Mental Status: She is alert and oriented to person, place, and time.   Psychiatric:        Mood and Affect: Mood normal.        Behavior: Behavior normal.      No results found for any visits on 12/09/21.    The ASCVD Risk score (Arnett DK, et al., 2019) failed to calculate for the following reasons:   The 2019 ASCVD risk score is only valid for ages 55 to 87    Assessment & Plan:   Problem List Items Addressed This Visit       Other   Atypical pigmented lesion - Primary   Relevant Orders   Ambulatory referral to Dermatology    No follow-ups on file.  Benign nevus versus atypical nevus versus fibroadenoma versus???  [name], MD

## 2021-12-10 ENCOUNTER — Ambulatory Visit: Payer: 59 | Admitting: Physical Therapy

## 2021-12-16 ENCOUNTER — Other Ambulatory Visit: Payer: Self-pay | Admitting: Family Medicine

## 2021-12-16 DIAGNOSIS — S39012A Strain of muscle, fascia and tendon of lower back, initial encounter: Secondary | ICD-10-CM

## 2021-12-18 ENCOUNTER — Other Ambulatory Visit: Payer: Self-pay | Admitting: Internal Medicine

## 2021-12-18 DIAGNOSIS — K2970 Gastritis, unspecified, without bleeding: Secondary | ICD-10-CM

## 2022-01-25 ENCOUNTER — Other Ambulatory Visit: Payer: Self-pay | Admitting: Family Medicine

## 2022-01-25 DIAGNOSIS — S39012A Strain of muscle, fascia and tendon of lower back, initial encounter: Secondary | ICD-10-CM

## 2022-01-28 DIAGNOSIS — Z808 Family history of malignant neoplasm of other organs or systems: Secondary | ICD-10-CM | POA: Diagnosis not present

## 2022-01-28 DIAGNOSIS — D229 Melanocytic nevi, unspecified: Secondary | ICD-10-CM | POA: Diagnosis not present

## 2022-01-28 DIAGNOSIS — L821 Other seborrheic keratosis: Secondary | ICD-10-CM | POA: Diagnosis not present

## 2022-02-17 ENCOUNTER — Encounter: Payer: Self-pay | Admitting: Family Medicine

## 2022-03-07 DIAGNOSIS — Z111 Encounter for screening for respiratory tuberculosis: Secondary | ICD-10-CM | POA: Diagnosis not present

## 2022-03-09 DIAGNOSIS — Z0279 Encounter for issue of other medical certificate: Secondary | ICD-10-CM | POA: Diagnosis not present

## 2022-03-09 DIAGNOSIS — Z021 Encounter for pre-employment examination: Secondary | ICD-10-CM | POA: Diagnosis not present

## 2022-03-09 DIAGNOSIS — Z6829 Body mass index (BMI) 29.0-29.9, adult: Secondary | ICD-10-CM | POA: Diagnosis not present

## 2022-03-09 DIAGNOSIS — Z23 Encounter for immunization: Secondary | ICD-10-CM | POA: Diagnosis not present

## 2022-07-31 ENCOUNTER — Encounter: Payer: Self-pay | Admitting: Family Medicine

## 2022-07-31 MED ORDER — ESCITALOPRAM OXALATE 10 MG PO TABS: 10.0000 mg | ORAL_TABLET | Freq: Every day | ORAL | 0 refills | Status: DC

## 2022-07-31 MED ORDER — BUPROPION HCL ER (XL) 300 MG PO TB24: 300.0000 mg | ORAL_TABLET | Freq: Every day | ORAL | 0 refills | Status: DC

## 2022-07-31 NOTE — Progress Notes (Signed)
Computer down. Sent in 90 day rx. Patient advised to reschedule within a month.

## 2022-09-04 ENCOUNTER — Other Ambulatory Visit: Payer: Self-pay | Admitting: Internal Medicine

## 2022-09-04 ENCOUNTER — Telehealth: Payer: Self-pay | Admitting: Family Medicine

## 2022-09-04 ENCOUNTER — Encounter: Payer: Self-pay | Admitting: Family Medicine

## 2022-09-04 ENCOUNTER — Ambulatory Visit (INDEPENDENT_AMBULATORY_CARE_PROVIDER_SITE_OTHER): Payer: Self-pay | Admitting: Family Medicine

## 2022-09-04 VITALS — BP 114/80 | HR 74 | Temp 98.7°F | Ht 67.0 in | Wt 203.4 lb

## 2022-09-04 DIAGNOSIS — K2970 Gastritis, unspecified, without bleeding: Secondary | ICD-10-CM

## 2022-09-04 DIAGNOSIS — Z975 Presence of (intrauterine) contraceptive device: Secondary | ICD-10-CM

## 2022-09-04 DIAGNOSIS — F418 Other specified anxiety disorders: Secondary | ICD-10-CM

## 2022-09-04 MED ORDER — BUPROPION HCL ER (XL) 300 MG PO TB24: 300.0000 mg | ORAL_TABLET | Freq: Every day | ORAL | 1 refills | Status: DC

## 2022-09-04 MED ORDER — ESCITALOPRAM OXALATE 20 MG PO TABS
20.0000 mg | ORAL_TABLET | Freq: Every day | ORAL | 1 refills | Status: DC
Start: 2022-09-04 — End: 2023-04-22

## 2022-09-04 NOTE — Progress Notes (Signed)
Established Patient Office Visit   Subjective:  Patient ID: Karen Webb, female    DOB: 2000/06/04  Age: 22 y.o. MRN: [id number]  Chief Complaint  Patient presents with   Medical Management of Chronic Issues    Follow up depression. Pt is doing olay. Wants to know if she can have more virtual visit than face to face.     HPI Encounter Diagnoses  Name Primary?   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  She is enjoying it but there is a lot of stress at work.  She is dealing with some difficulty with focus and has trouble remembering things and why she writes them down.  She will graduate in May.  She lives with her fianc.  Their relationship is good.  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   Review of Systems  Constitutional: Negative.   HENT: Negative.    Eyes:  Negative for blurred vision, discharge and redness.  Respiratory: Negative.    Cardiovascular: Negative.   Gastrointestinal:  Negative for abdominal pain.  Genitourinary: Negative.   Musculoskeletal: Negative.  Negative for myalgias.  Skin:  Negative for rash.  Neurological:  Negative for tingling, loss of consciousness and weakness.  Endo/Heme/Allergies:  Negative for polydipsia.      09/04/2022    2:37 PM 12/09/2021    3:12 PM 11/28/2021    9:01 AM  Depression screen PHQ 2/9  Decreased Interest 3 0 0  Down, Depressed, Hopeless 1 0 0  PHQ - 2 Score 4 0 0  Altered sleeping 2  1  Tired, decreased energy 3  1  Change in appetite 2  1  Feeling bad or failure about yourself  1  0  Trouble concentrating 2  1  Moving slowly or fidgety/restless 2  0  Suicidal thoughts 0  0  PHQ-9 Score 16  4  Difficult doing work/chores Very difficult  Somewhat difficult      Current Outpatient Medications:    cetirizine (ZYRTEC) 10 MG tablet, Take 10 mg by mouth daily. Prn allergies, Disp: , Rfl:    dicyclomine (BENTYL) 20 MG  tablet, Take 1 tablet (20 mg total) by mouth as needed (As needed up to three times a day for abdominal pain)., Disp: 20 tablet, Rfl: 0   escitalopram (LEXAPRO) 20 MG tablet, Take 1 tablet (20 mg total) by mouth daily., Disp: 90 tablet, Rfl: 1   fluticasone (FLONASE) 50 MCG/ACT nasal spray, Place into both nostrils daily. Prn allergies, Disp: , Rfl:    hydrOXYzine (ATARAX) 10 MG tablet, Take 1 tablet (10 mg total) by mouth 3 (three) times daily as needed for anxiety., Disp: 90 tablet, Rfl: 2   levonorgestrel (KYLEENA) 19.5 MG IUD, by Intrauterine route., Disp: , Rfl:    pantoprazole (PROTONIX) 40 MG tablet, TAKE 1 TABLET BY MOUTH EVERY DAY, Disp: 90 tablet, Rfl: 2   buPROPion (WELLBUTRIN XL) 300 MG 24 hr tablet, Take 1 tablet (300 mg total) by mouth daily., Disp: 90 tablet, Rfl: 1   Objective:     BP 114/80   Pulse 74   Temp 98.7 F (37.1 C)   Ht 5\' 7"  (1.702 m)   Wt 203 lb 6.4 oz (92.3 kg)   SpO2 99%   BMI 31.86 kg/m    Physical Exam Constitutional:      General: She is not in acute distress.  Appearance: Normal appearance. She is not ill-appearing, toxic-appearing or diaphoretic.  HENT:     Head: Normocephalic and atraumatic.     Right Ear: External ear normal.     Left Ear: External ear normal.  Eyes:     General: No scleral icterus.       Right eye: No discharge.        Left eye: No discharge.     Extraocular Movements: Extraocular movements intact.     Conjunctiva/sclera: Conjunctivae normal.  Pulmonary:     Effort: Pulmonary effort is normal. No respiratory distress.  Skin:    General: Skin is warm and dry.  Neurological:     Mental Status: She is alert and oriented to person, place, and time.  Psychiatric:        Mood and Affect: Mood normal.        Behavior: Behavior normal.      No results found for any visits on 09/04/22.    The ASCVD Risk score (Arnett DK, et al., 2019) failed to calculate for the following reasons:   The 2019 ASCVD risk score is  only valid for ages 79 to 53    Assessment & Plan:   Depression with anxiety -     buPROPion HCl ER (XL); Take 1 tablet (300 mg total) by mouth daily.  Dispense: 90 tablet; Refill: 1 -     Escitalopram Oxalate; Take 1 tablet (20 mg total) by mouth daily.  Dispense: 90 tablet; Refill: 1  IUD contraception    Return in about 3 months (around 12/05/2022).  Have increased Lexapro to 20 mg daily.  Continue bupropion 300 mg XL.  Encouraged regular exercise by walking for 30 minutes most days of the week.  Zoom follow-up in 3 months.  Mliss Sax, MD

## 2022-10-06 DIAGNOSIS — R5381 Other malaise: Secondary | ICD-10-CM | POA: Diagnosis not present

## 2022-10-06 DIAGNOSIS — R55 Syncope and collapse: Secondary | ICD-10-CM | POA: Diagnosis not present

## 2022-10-06 DIAGNOSIS — B349 Viral infection, unspecified: Secondary | ICD-10-CM | POA: Diagnosis not present

## 2022-10-06 DIAGNOSIS — R110 Nausea: Secondary | ICD-10-CM | POA: Diagnosis not present

## 2022-10-09 ENCOUNTER — Ambulatory Visit: Payer: BC Managed Care – PPO | Admitting: Physician Assistant

## 2022-10-09 ENCOUNTER — Encounter: Payer: Self-pay | Admitting: Physician Assistant

## 2022-10-09 VITALS — BP 130/70 | HR 117 | Ht 68.0 in | Wt 208.4 lb

## 2022-10-09 DIAGNOSIS — R1030 Lower abdominal pain, unspecified: Secondary | ICD-10-CM | POA: Diagnosis not present

## 2022-10-09 DIAGNOSIS — K580 Irritable bowel syndrome with diarrhea: Secondary | ICD-10-CM

## 2022-10-09 DIAGNOSIS — K219 Gastro-esophageal reflux disease without esophagitis: Secondary | ICD-10-CM | POA: Diagnosis not present

## 2022-10-09 DIAGNOSIS — K2970 Gastritis, unspecified, without bleeding: Secondary | ICD-10-CM

## 2022-10-09 DIAGNOSIS — R42 Dizziness and giddiness: Secondary | ICD-10-CM

## 2022-10-09 MED ORDER — FAMOTIDINE 40 MG PO TABS
40.0000 mg | ORAL_TABLET | Freq: Every day | ORAL | 0 refills | Status: DC
Start: 2022-10-09 — End: 2023-01-18

## 2022-10-09 MED ORDER — PANTOPRAZOLE SODIUM 40 MG PO TBEC: 40.0000 mg | DELAYED_RELEASE_TABLET | Freq: Every day | ORAL | 2 refills | Status: DC

## 2022-10-09 MED ORDER — DICYCLOMINE HCL 20 MG PO TABS: 20.0000 mg | ORAL_TABLET | Freq: Three times a day (TID) | ORAL | 2 refills | Status: DC | PRN

## 2022-10-09 NOTE — Progress Notes (Signed)
I agree with the assessment and plan as outlined by Ms. Collier. 

## 2022-10-09 NOTE — Patient Instructions (Addendum)
You have been scheduled for an abdominal ultrasound at [facility]iology (1st floor of hospital) on 10/14/2022 at 7:00am. Please arrive 30 minutes prior to your appointment for registration. Make certain not to have anything to eat or drink 6 hours prior to your appointment. Should you need to reschedule your appointment, please contact radiology at [phone]. This test typically takes about 30 minutes to perform.     You have been given a testing kit to check for small intestine bacterial overgrowth (SIBO) which is completed by a company named [facility]. Make sure to return your test in the mail using the return mailing label given to you along with the kit. The test order, your demographic and insurance information have all already been sent to the company. [facility] will collect an upfront charge of $99.74 for commercial insurance plans and $209.74 if you are paying cash. Make sure to discuss with [facility] PRIOR to having the test to see if they have gotten information from your insurance company as to how much your testing will cost out of pocket, if any. Please contact [facility] at phone number [phone] to get instructions regarding how to perform the test as our office is unable to give specific testing instructions.   First do a trial off milk/lactose products if you use them.  Add fiber like benefiber or citracel once a day Increase activity Can do trial of IBGard which is over the counter for AB pain- Take 1-2 capsules once a day for maintence or twice a day during a flare Can send in an anti spasm medication, Bentyl, to take as needed Please try to decrease stress. consider talking with PCP about anti anxiety medication or try head space app for meditation. if any worsening symptoms like blood in stool, weight loss, please call the office     FODMAP stands for fermentable oligo-, di-, mono-saccharides and polyols (1). These are the scientific terms  used to classify groups of carbs that are difficult for our body to digest and that are notorious for triggering digestive symptoms like bloating, gas, loose stools and stomach pain.   You can try low FODMAP diet  - start with eliminating just one column at a time that you feel may be a trigger for you. - the table at the very bottom contains foods that are low in FODMAPs   Sometimes trying to eliminate the FODMAP's from your diet is difficult or tricky, if you are stuggling with trying to do the elimination diet you can try an enzyme.  There is a food enzymes that you sprinkle in or on your food that helps break down the FODMAP. You can read more about the enzyme by going to this site: https://fodzyme.com/

## 2022-10-09 NOTE — Progress Notes (Signed)
10/09/2022 Karen Webb [id number] 30-Jul-2000   ASSESSMENT AND PLAN:    Gastroesophageal Reflux Disease (GERD) Patient reports continued symptoms despite Pantoprazole use. Symptoms worsen when medication is not taken. -Continue Pantoprazole 40mg  daily. -Consider adding Pepcid 40mg  at night. -Advise patient to avoid fatty foods and trial a lactose-free diet.  Abdominal Pain Lower abdominal pain reported, often relieved by bowel movements. Patient finds relief with Dicyclomine. -Continue Dicyclomine as needed. -Consider trial of over-the-counter Ibogard for daily use.  Small Intestinal Bacterial Overgrowth (SIBO) Symptoms suggestive of SIBO including bloating, gurgling, and altered bowel habits. -Order SIBO testing.  Right Upper Quadrant Tenderness Tenderness noted on physical examination. -Order right upper quadrant ultrasound.  Anxiety Patient on Wellbutrin and Lexapro. Recent increase in Wellbutrin dose. -Monitor closely for symptoms of serotonin syndrome, especially with recent increase in Wellbutrin dose.  Vaping Patient reports vaping. -Advise patient to stop vaping due to potential exacerbation of gastrointestinal symptoms.  Follow-up -Schedule follow-up appointment with primary care provider to evaluate for potential autonomic dysfunction. -Consider referral to cardiology if symptoms persist.       History of Present Illness:  22 y.o. female  with a past medical history of depression, constipation, hemorrhoids and others listed below, known to Dr. Leonides Schanz returns to clinic today for evaluation of constipation, rectal bleeding, GERD  05/08/21 and 05/08/21 EGD showed neg H pylori gastritis, normal colon.   Discussed the use of AI scribe software for clinical note transcription with the patient, who gave verbal consent to proceed.  History of Present Illness   The patient, with a history of gastrointestinal issues, presents for a follow-up visit after a  recent colonoscopy and endoscopy. The procedures were performed due to the patient's complaints of intense stomach pain and were found to be normal, which was a relief for the patient given her family history. Despite the normal findings, the patient continues to experience occasional severe abdominal pain, particularly in the lower abdomen. The pain is often relieved by bowel movements, but can leave the abdomen tender and sensitive for the rest of the day. The patient has been managing these episodes with an antispasmodic medication, dicyclomine, which she reports to be very effective.  The patient also reports issues with acid reflux, which she manages with daily pantoprazole. She has tried to reduce the dosage, but found that her body reacts negatively when she does not take it.  Over the summer, the patient experienced an episode of numbness in all extremities, accompanied by a feeling of impending faintness and vomiting. This episode led to an ambulance being called, and the patient was told she may have a heart irregularity. The patient also reports occasional fuzziness and black spots in her vision.  The patient is currently on Wellbutrin and Lexapro for mental health management. She has recently had her Wellbutrin dosage increased, which she has not yet started taking. She also reports vaping, which she started without a prior smoking habit.  The patient has been experiencing bloating and changes in bowel habits, with a tendency towards loose stools. She also reports frequent bloating and occasional days without bowel movements. The patient has been experiencing tenderness in the right upper quadrant of the abdomen, which has not been previously investigated.  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      Wt Readings from Last 3 Encounters:  10/09/22 208 lb 6.4 oz (94.5 kg)  09/04/22 203 lb 6.4 oz (92.3  kg)  12/09/21  187 lb 9.6 oz (85.1 kg)       Current Medications:   Current Outpatient Medications (Endocrine & Metabolic):    levonorgestrel (KYLEENA) 19.5 MG IUD, by Intrauterine route.   Current Outpatient Medications (Respiratory):    cetirizine (ZYRTEC) 10 MG tablet, Take 10 mg by mouth daily. Prn allergies   fluticasone (FLONASE) 50 MCG/ACT nasal spray, Place into both nostrils daily. Prn allergies    Current Outpatient Medications (Other):    buPROPion (WELLBUTRIN XL) 300 MG 24 hr tablet, Take 1 tablet (300 mg total) by mouth daily.   escitalopram (LEXAPRO) 20 MG tablet, Take 1 tablet (20 mg total) by mouth daily.   famotidine (PEPCID) 40 MG tablet, Take 1 tablet (40 mg total) by mouth at bedtime.   hydrOXYzine (ATARAX) 10 MG tablet, Take 1 tablet (10 mg total) by mouth 3 (three) times daily as needed for anxiety.   dicyclomine (BENTYL) 20 MG tablet, Take 1 tablet (20 mg total) by mouth 3 (three) times daily as needed (As needed up to three times a day for abdominal pain).   pantoprazole (PROTONIX) 40 MG tablet, Take 1 tablet (40 mg total) by mouth daily.  Surgical History:  She  has a past surgical history that includes Wisdom tooth extraction; Colonoscopy (05/08/2021); and Upper gastrointestinal endoscopy (05/08/2021). Family History:  Her family history includes ADD / ADHD in her sister; Alcohol abuse in her mother; Anxiety disorder in her mother; Bipolar disorder in her mother; Celiac disease in her maternal grandmother; Crohn's disease in her mother; Depression in her mother and sister; Heart disease in her paternal grandfather; Hypertension in her father; Irritable bowel syndrome in her mother; Other in her mother; Sleep apnea in her father. Social History:   reports that she has never smoked. She has never used smokeless tobacco. She reports current alcohol use. She reports that she does not use drugs.  Current Medications, Allergies, Past Medical History, Past Surgical History,  Family History and Social History were reviewed in [employer] record.  Physical Exam: BP 130/70   Pulse (!) 117   Ht 5\' 8"  (1.727 m)   Wt 208 lb 6.4 oz (94.5 kg)   BMI 31.69 kg/m  General:   Pleasant, well developed female in no acute distress Eyes: sclerae anicteric,conjunctive pink  Heart:  regular rate and rhythm Pulm: Clear anteriorly; no wheezing Abdomen:  Soft, Flat AB, skin exam normal, Normal bowel sounds. mild tenderness in the RUQ. Without guarding and Without rebound, without hepatomegaly. Extremities:  Without edema. Peripheral pulses intact.  Neurologic:  Alert and  oriented x4;  grossly normal neurologically. Skin:   Dry and intact without significant lesions or rashes. Psychiatric: Demonstrates good judgement and reason without abnormal affect or behaviors.  Doree Albee, PA-C 10/09/22

## 2022-10-12 ENCOUNTER — Encounter: Payer: Self-pay | Admitting: Family Medicine

## 2022-10-12 NOTE — Progress Notes (Signed)
This encounter was created in error - please disregard.

## 2022-10-13 ENCOUNTER — Ambulatory Visit: Payer: BC Managed Care – PPO | Admitting: Family Medicine

## 2022-10-13 ENCOUNTER — Encounter: Payer: Self-pay | Admitting: Family Medicine

## 2022-10-13 VITALS — BP 118/78 | HR 98 | Temp 98.3°F | Ht 68.0 in | Wt 209.6 lb

## 2022-10-13 DIAGNOSIS — F418 Other specified anxiety disorders: Secondary | ICD-10-CM | POA: Diagnosis not present

## 2022-10-13 DIAGNOSIS — R000 Tachycardia, unspecified: Secondary | ICD-10-CM | POA: Insufficient documentation

## 2022-10-13 DIAGNOSIS — R42 Dizziness and giddiness: Secondary | ICD-10-CM | POA: Diagnosis not present

## 2022-10-13 NOTE — Progress Notes (Signed)
Established Patient Office Visit   Subjective:  Patient ID: Karen Webb, female    DOB: 2000-11-12  Age: 22 y.o. MRN: [id number]  Chief Complaint  Patient presents with   Numbness    Headache with numbness 1 week ago.     HPI Encounter Diagnoses  Name Primary?   Light headedness Yes   Elevated pulse rate    Depression with anxiety    Describes an episode while she was at school 1 week ago of lightheadedness accompanied by ascending paresthesias in her legs and arms, elevated heart rate and loss of vision and hearing.  She promptly laid flat on the floor and her symptoms resolved to 10 or 15 minutes.  EMS was summoned and documented an elevated heart rate.  She has experienced these episodes multiple times over the years.  The last was 6 months ago.  IUD for contraception and she no longer has menstrual flow.  She is concerned about POTS syndrome.  She is working as a Arts development officer and has difficulty keeping herself hydrated because it is difficult for her to use the bathroom when needed.   Review of Systems  Constitutional: Negative.   HENT: Negative.    Eyes:  Negative for blurred vision, discharge and redness.  Respiratory: Negative.    Cardiovascular:  Positive for palpitations.  Gastrointestinal:  Negative for abdominal pain.  Genitourinary: Negative.   Musculoskeletal: Negative.  Negative for myalgias.  Skin:  Negative for rash.  Neurological:  Positive for dizziness and tingling. Negative for loss of consciousness and weakness.  Endo/Heme/Allergies:  Negative for polydipsia.     Current Outpatient Medications:    buPROPion (WELLBUTRIN XL) 300 MG 24 hr tablet, Take 1 tablet (300 mg total) by mouth daily., Disp: 90 tablet, Rfl: 1   cetirizine (ZYRTEC) 10 MG tablet, Take 10 mg by mouth daily. Prn allergies, Disp: , Rfl:    dicyclomine (BENTYL) 20 MG tablet, Take 1 tablet (20 mg total) by mouth 3 (three) times daily as needed (As needed up to three times a day for  abdominal pain)., Disp: 90 tablet, Rfl: 2   escitalopram (LEXAPRO) 20 MG tablet, Take 1 tablet (20 mg total) by mouth daily., Disp: 90 tablet, Rfl: 1   famotidine (PEPCID) 40 MG tablet, Take 1 tablet (40 mg total) by mouth at bedtime., Disp: 90 tablet, Rfl: 0   fluticasone (FLONASE) 50 MCG/ACT nasal spray, Place into both nostrils daily. Prn allergies, Disp: , Rfl:    hydrOXYzine (ATARAX) 10 MG tablet, Take 1 tablet (10 mg total) by mouth 3 (three) times daily as needed for anxiety., Disp: 90 tablet, Rfl: 2   levonorgestrel (KYLEENA) 19.5 MG IUD, by Intrauterine route., Disp: , Rfl:    pantoprazole (PROTONIX) 40 MG tablet, Take 1 tablet (40 mg total) by mouth daily., Disp: 90 tablet, Rfl: 2   Objective:     BP 118/78   Pulse 98   Temp 98.3 F (36.8 C)   Ht 5\' 8"  (1.727 m)   Wt 209 lb 9.6 oz (95.1 kg)   SpO2 97%   BMI 31.87 kg/m    Physical Exam Constitutional:      General: She is not in acute distress.    Appearance: Normal appearance. She is not ill-appearing, toxic-appearing or diaphoretic.  HENT:     Head: Normocephalic and atraumatic.     Right Ear: External ear normal.     Left Ear: External ear normal.     Mouth/Throat:  Mouth: Mucous membranes are moist.     Pharynx: Oropharynx is clear. No oropharyngeal exudate or posterior oropharyngeal erythema.  Eyes:     General: No scleral icterus.       Right eye: No discharge.        Left eye: No discharge.     Extraocular Movements: Extraocular movements intact.     Conjunctiva/sclera: Conjunctivae normal.     Pupils: Pupils are equal, round, and reactive to light.  Cardiovascular:     Rate and Rhythm: Normal rate and regular rhythm.  Pulmonary:     Effort: Pulmonary effort is normal. No respiratory distress.     Breath sounds: Normal breath sounds. No wheezing, rhonchi or rales.  Abdominal:     General: Bowel sounds are normal.  Musculoskeletal:     Cervical back: No rigidity or tenderness.  Skin:    General:  Skin is warm and dry.  Neurological:     Mental Status: She is alert and oriented to person, place, and time.  Psychiatric:        Mood and Affect: Mood normal.        Behavior: Behavior normal.      No results found for any visits on 10/13/22.    The ASCVD Risk score (Arnett DK, et al., 2019) failed to calculate for the following reasons:   The 2019 ASCVD risk score is only valid for ages 36 to 28    Assessment & Plan:   Light headedness -     CBC -     Comprehensive metabolic panel -     Urinalysis, Routine w reflex microscopic  Elevated pulse rate -     TSH  Depression with anxiety    Return Has follow-up scheduled in November..  Patient did not tilt with orthostatic checks.  Would consider dehydration, stress.  Could consider POTS.  Briefly discussed her cardiology referral.  Mliss Sax, MD

## 2022-10-14 ENCOUNTER — Ambulatory Visit (HOSPITAL_COMMUNITY): Admission: RE | Admit: 2022-10-14 | Payer: BC Managed Care – PPO | Source: Ambulatory Visit

## 2022-10-14 LAB — COMPREHENSIVE METABOLIC PANEL
ALT: 48 U/L — ABNORMAL HIGH (ref 0–35)
AST: 34 U/L (ref 0–37)
Albumin: 4.8 g/dL (ref 3.5–5.2)
Alkaline Phosphatase: 66 U/L (ref 39–117)
BUN: 10 mg/dL (ref 6–23)
CO2: 27 meq/L (ref 19–32)
Calcium: 9.7 mg/dL (ref 8.4–10.5)
Chloride: 101 meq/L (ref 96–112)
Creatinine, Ser: 0.63 mg/dL (ref 0.40–1.20)
GFR: 125.7 mL/min (ref >60.00)
Glucose, Bld: 77 mg/dL (ref 70–99)
Potassium: 4.4 meq/L (ref 3.5–5.1)
Sodium: 137 meq/L (ref 135–145)
Total Bilirubin: 0.7 mg/dL (ref 0.2–1.2)
Total Protein: 7.6 g/dL (ref 6.0–8.3)

## 2022-10-14 LAB — CBC
HCT: 38.9 % (ref 36.0–46.0)
Hemoglobin: 13.4 g/dL (ref 12.0–15.0)
MCHC: 34.4 g/dL (ref 30.0–36.0)
MCV: 88.2 fL (ref 78.0–100.0)
Platelets: 258 10*3/uL (ref 150.0–400.0)
RBC: 4.41 Mil/uL (ref 3.87–5.11)
RDW: 12.7 % (ref 11.5–15.5)
WBC: 7.9 10*3/uL (ref 4.0–10.5)

## 2022-10-14 LAB — URINALYSIS, ROUTINE W REFLEX MICROSCOPIC
Bilirubin Urine: NEGATIVE
Hgb urine dipstick: NEGATIVE
Ketones, ur: NEGATIVE
Nitrite: NEGATIVE
Specific Gravity, Urine: 1.02 (ref 1.000–1.030)
Total Protein, Urine: NEGATIVE
Urine Glucose: NEGATIVE
Urobilinogen, UA: 0.2 (ref 0.0–1.0)
pH: 6 (ref 5.0–8.0)

## 2022-10-14 LAB — TSH: TSH: 1.61 u[IU]/mL (ref 0.35–5.50)

## 2022-10-25 ENCOUNTER — Other Ambulatory Visit: Payer: Self-pay

## 2022-10-25 ENCOUNTER — Encounter (HOSPITAL_BASED_OUTPATIENT_CLINIC_OR_DEPARTMENT_OTHER): Payer: Self-pay

## 2022-10-25 ENCOUNTER — Emergency Department (HOSPITAL_BASED_OUTPATIENT_CLINIC_OR_DEPARTMENT_OTHER): Payer: BC Managed Care – PPO

## 2022-10-25 ENCOUNTER — Emergency Department (HOSPITAL_BASED_OUTPATIENT_CLINIC_OR_DEPARTMENT_OTHER)
Admission: EM | Admit: 2022-10-25 | Discharge: 2022-10-25 | Disposition: A | Discharge status: Home / Self Care | Payer: BC Managed Care – PPO | Attending: Emergency Medicine | Admitting: Emergency Medicine

## 2022-10-25 DIAGNOSIS — R109 Unspecified abdominal pain: Secondary | ICD-10-CM | POA: Diagnosis not present

## 2022-10-25 DIAGNOSIS — K760 Fatty (change of) liver, not elsewhere classified: Secondary | ICD-10-CM | POA: Diagnosis not present

## 2022-10-25 DIAGNOSIS — D72829 Elevated white blood cell count, unspecified: Secondary | ICD-10-CM | POA: Diagnosis not present

## 2022-10-25 DIAGNOSIS — R1010 Upper abdominal pain, unspecified: Secondary | ICD-10-CM | POA: Diagnosis not present

## 2022-10-25 DIAGNOSIS — K5900 Constipation, unspecified: Secondary | ICD-10-CM | POA: Diagnosis not present

## 2022-10-25 LAB — COMPREHENSIVE METABOLIC PANEL
ALT: 22 U/L (ref 0–44)
AST: 26 U/L (ref 15–41)
Albumin: 4.4 g/dL (ref 3.5–5.0)
Alkaline Phosphatase: 56 U/L (ref 38–126)
Anion gap: 20 — ABNORMAL HIGH (ref 5–15)
BUN: 11 mg/dL (ref 6–20)
CO2: 19 mmol/L — ABNORMAL LOW (ref 22–32)
Calcium: 9.4 mg/dL (ref 8.9–10.3)
Chloride: 100 mmol/L (ref 98–111)
Creatinine, Ser: 0.75 mg/dL (ref 0.44–1.00)
GFR, Estimated: >60 mL/min (ref >60)
Glucose, Bld: 144 mg/dL — ABNORMAL HIGH (ref 70–99)
Potassium: 3.9 mmol/L (ref 3.5–5.1)
Sodium: 139 mmol/L (ref 135–145)
Total Bilirubin: 0.9 mg/dL (ref 0.3–1.2)
Total Protein: 7.6 g/dL (ref 6.5–8.1)

## 2022-10-25 LAB — CBC WITH DIFFERENTIAL/PLATELET
Abs Immature Granulocytes: 0.04 10*3/uL (ref 0.00–0.07)
Basophils Absolute: 0 10*3/uL (ref 0.0–0.1)
Basophils Relative: 0 %
Eosinophils Absolute: 0.1 10*3/uL (ref 0.0–0.5)
Eosinophils Relative: 1 %
HCT: 39.2 % (ref 36.0–46.0)
Hemoglobin: 13.7 g/dL (ref 12.0–15.0)
Immature Granulocytes: 0 %
Lymphocytes Relative: 13 %
Lymphs Abs: 1.4 10*3/uL (ref 0.7–4.0)
MCH: 30.4 pg (ref 26.0–34.0)
MCHC: 34.9 g/dL (ref 30.0–36.0)
MCV: 86.9 fL (ref 80.0–100.0)
Monocytes Absolute: 0.9 10*3/uL (ref 0.1–1.0)
Monocytes Relative: 8 %
Neutro Abs: 9.1 10*3/uL — ABNORMAL HIGH (ref 1.7–7.7)
Neutrophils Relative %: 78 %
Platelets: 289 10*3/uL (ref 150–400)
RBC: 4.51 MIL/uL (ref 3.87–5.11)
RDW: 12.5 % (ref 11.5–15.5)
WBC: 11.6 10*3/uL — ABNORMAL HIGH (ref 4.0–10.5)
nRBC: 0 % (ref 0.0–0.2)

## 2022-10-25 LAB — URINALYSIS, ROUTINE W REFLEX MICROSCOPIC
Glucose, UA: NEGATIVE mg/dL
Hgb urine dipstick: NEGATIVE
Ketones, ur: NEGATIVE mg/dL
Nitrite: NEGATIVE
Protein, ur: NEGATIVE mg/dL
Specific Gravity, Urine: 1.025 (ref 1.005–1.030)
pH: 6 (ref 5.0–8.0)

## 2022-10-25 LAB — URINALYSIS, MICROSCOPIC (REFLEX): RBC / HPF: NONE SEEN RBC/hpf (ref 0–5)

## 2022-10-25 LAB — HCG, SERUM, QUALITATIVE: Preg, Serum: NEGATIVE

## 2022-10-25 LAB — LIPASE, BLOOD: Lipase: 27 U/L (ref 11–51)

## 2022-10-25 MED ORDER — ONDANSETRON HCL 4 MG/2ML IJ SOLN
4.0000 mg | Freq: Once | INTRAMUSCULAR | Status: AC
  Administered 2022-10-25: 4 mg via INTRAVENOUS
  Filled 2022-10-25: qty 2

## 2022-10-25 MED ORDER — FENTANYL CITRATE PF 50 MCG/ML IJ SOSY
50.0000 ug | PREFILLED_SYRINGE | Freq: Once | INTRAMUSCULAR | Status: AC
  Administered 2022-10-25: 50 ug via INTRAVENOUS
  Filled 2022-10-25: qty 1

## 2022-10-25 MED ORDER — IOHEXOL 300 MG/ML  SOLN
100.0000 mL | Freq: Once | INTRAMUSCULAR | Status: AC | PRN
  Administered 2022-10-25: 100 mL via INTRAVENOUS

## 2022-10-25 MED ORDER — PANTOPRAZOLE SODIUM 40 MG IV SOLR
40.0000 mg | Freq: Once | INTRAVENOUS | Status: AC
  Administered 2022-10-25: 40 mg via INTRAVENOUS
  Filled 2022-10-25: qty 10

## 2022-10-25 NOTE — ED Triage Notes (Signed)
Pt with abd pain in the upper quadrants that radiates down that began at 12:30 am. Pt reports hot sweats when pain gets really bad. Pt with hx of gastro issues (IBS). Pt taking Protonix and Bentyl at home. Family thinks she needs [place] to check for gallstones. Pt endorses nausea with increase in pain. Pt unable to have BM in "a while".

## 2022-10-25 NOTE — ED Provider Notes (Signed)
[place] [facility]MENT AT [facility]  Provider Note  CSN: [id number] Arrival date & time: 10/25/22 6578  History Chief Complaint  Patient presents with   Abdominal Pain    Karen Webb is a 22 y.o. female with history of GERD, IBS was seen at GI on 9.27 for abdominal pain, referred for outpatient [place] but was told she would have to pay $600 up front and so she cancelled it. Tonight she has had several hours of intermittent epigastric pain radiating to lower abdomen with nausea. No vomiting. No diarrhea. Last BM was yesterday. No urinary symptoms. Took bentyl and protonix at home without resolution.   Home Medications Prior to Admission medications   Medication Sig Start Date End Date Taking? Authorizing Provider  buPROPion (WELLBUTRIN XL) 300 MG 24 hr tablet Take 1 tablet (300 mg total) by mouth daily. 09/04/22   Mliss Sax, MD  cetirizine (ZYRTEC) 10 MG tablet Take 10 mg by mouth daily. Prn allergies    [provider]  dicyclomine (BENTYL) 20 MG tablet Take 1 tablet (20 mg total) by mouth 3 (three) times daily as needed (As needed up to three times a day for abdominal pain). 10/09/22   Doree Albee, PA-C  escitalopram (LEXAPRO) 20 MG tablet Take 1 tablet (20 mg total) by mouth daily. 09/04/22   Mliss Sax, MD  famotidine (PEPCID) 40 MG tablet Take 1 tablet (40 mg total) by mouth at bedtime. 10/09/22   Doree Albee, PA-C  fluticasone (FLONASE) 50 MCG/ACT nasal spray Place into both nostrils daily. Prn allergies    [provider]  hydrOXYzine (ATARAX) 10 MG tablet Take 1 tablet (10 mg total) by mouth 3 (three) times daily as needed for anxiety. 09/30/21   Karsten Ro, MD  levonorgestrel (KYLEENA) 19.5 MG IUD by Intrauterine route.    [provider]  pantoprazole (PROTONIX) 40 MG tablet Take 1 tablet (40 mg total) by mouth daily. 10/09/22   Doree Albee, PA-C     Allergies    Patient has no known  allergies.   Review of Systems   Review of Systems Please see HPI for pertinent positives and negatives  Physical Exam BP 133/88   Pulse 93   Temp 98.3 F (36.8 C) (Oral)   Resp 20   Ht 5\' 8"  (1.727 m)   Wt 93 kg   LMP  (LMP Unknown) Comment: Does not get periodswith BC  SpO2 97%   BMI 31.17 kg/m   Physical Exam Vitals and nursing note reviewed.  Constitutional:      Appearance: Normal appearance.  HENT:     Head: Normocephalic and atraumatic.     Nose: Nose normal.     Mouth/Throat:     Mouth: Mucous membranes are moist.  Eyes:     Extraocular Movements: Extraocular movements intact.     Conjunctiva/sclera: Conjunctivae normal.  Cardiovascular:     Rate and Rhythm: Normal rate.  Pulmonary:     Effort: Pulmonary effort is normal.     Breath sounds: Normal breath sounds.  Abdominal:     General: Abdomen is flat.     Palpations: Abdomen is soft.     Tenderness: There is abdominal tenderness in the epigastric area. There is no guarding. Negative signs include Murphy's sign and McBurney's sign.  Musculoskeletal:        General: No swelling. Normal range of motion.     Cervical back: Neck supple.  Skin:  General: Skin is warm and dry.  Neurological:     General: No focal deficit present.     Mental Status: She is alert.  Psychiatric:        Mood and Affect: Mood normal.     ED Results / Procedures / Treatments   EKG None  Procedures Procedures  Medications Ordered in the ED Medications  fentaNYL (SUBLIMAZE) injection 50 mcg (50 mcg Intravenous Given 10/25/22 0254)  ondansetron (ZOFRAN) injection 4 mg (4 mg Intravenous Given 10/25/22 0255)  pantoprazole (PROTONIX) injection 40 mg (40 mg Intravenous Given 10/25/22 0331)  iohexol (OMNIPAQUE) 300 MG/ML solution 100 mL (100 mLs Intravenous Contrast Given 10/25/22 0404)    Initial Impression and Plan  Patient here with nonspecific abdominal pain, she is particularly concerned about gall stones. Will check  labs, [place] is not available now, so will send for CT. Pain meds for comfort.   ED Course   Clinical Course as of 10/25/22 0513  Wynelle Link Oct 25, 2022  0307 CBC with mild leukocytosis.  [CS]  0329 CMP and lipase are unremarkable, mildly elevated anion gap of unclear significance.  [CS]  0333 HCG is neg.  [CS]  [id number] I personally viewed the images from radiology studies and agree with radiologist interpretation: CT shows constipation and maybe mild colitis, doubt this is infectious and more likely due to her constipation.  [CS]  0502 UA is negative for infection.  [CS]  [id number] Patient is resting comfortably. Discussed lab and CT results with her. Recommend an aggressive bowel regimen including miralax, mag citrate and/or enema for her constipation. Rx Bentyl give by GI to help with cramps. PCP follow up, RTED for any other concerns.   [CS]    Clinical Course User Index [CS] Pollyann Savoy, MD     MDM Rules/Calculators/A&P Medical Decision Making Problems Addressed: Constipation, unspecified constipation type: acute illness or injury  Amount and/or Complexity of Data Reviewed Labs: ordered. Decision-making details documented in ED Course. Radiology: ordered and independent interpretation performed. Decision-making details documented in ED Course.  Risk Prescription drug management. Parenteral controlled substances.     Final Clinical Impression(s) / ED Diagnoses Final diagnoses:  Constipation, unspecified constipation type    Rx / DC Orders ED Discharge Orders     None        Pollyann Savoy, MD 10/25/22 [phone]

## 2022-10-25 NOTE — ED Notes (Signed)
Pt has been unable to provide UA specimen at this time. Pt has been to the bathroom x2 to try and have a BM.

## 2022-12-03 ENCOUNTER — Encounter: Payer: Self-pay | Admitting: Family Medicine

## 2022-12-03 ENCOUNTER — Encounter: Payer: BC Managed Care – PPO | Admitting: Family Medicine

## 2022-12-03 DIAGNOSIS — F339 Major depressive disorder, recurrent, unspecified: Secondary | ICD-10-CM

## 2022-12-03 DIAGNOSIS — F418 Other specified anxiety disorders: Secondary | ICD-10-CM

## 2022-12-03 NOTE — Progress Notes (Deleted)
   Established Patient Office Visit   Subjective:  Patient ID: Karen Webb, female    DOB: [birth year]  Age: 22 y.o. MRN: [id number]  Chief Complaint  Patient presents with   Medical Management of Chronic Issues    Medication Refill. Pt states she has been very well and Medication are helping her focus and be more organized.    HPI Encounter Diagnoses  Name Primary?   Depression with anxiety    *** {History (Optional):23778}  ROS   Current Outpatient Medications:    buPROPion (WELLBUTRIN XL) 300 MG 24 hr tablet, Take 1 tablet (300 mg total) by mouth daily., Disp: 90 tablet, Rfl: 1   cetirizine (ZYRTEC) 10 MG tablet, Take 10 mg by mouth daily. Prn allergies, Disp: , Rfl:    dicyclomine (BENTYL) 20 MG tablet, Take 1 tablet (20 mg total) by mouth 3 (three) times daily as needed (As needed up to three times a day for abdominal pain)., Disp: 90 tablet, Rfl: 2   escitalopram (LEXAPRO) 20 MG tablet, Take 1 tablet (20 mg total) by mouth daily., Disp: 90 tablet, Rfl: 1   famotidine (PEPCID) 40 MG tablet, Take 1 tablet (40 mg total) by mouth at bedtime., Disp: 90 tablet, Rfl: 0   fluticasone (FLONASE) 50 MCG/ACT nasal spray, Place into both nostrils daily. Prn allergies, Disp: , Rfl:    hydrOXYzine (ATARAX) 10 MG tablet, Take 1 tablet (10 mg total) by mouth 3 (three) times daily as needed for anxiety., Disp: 90 tablet, Rfl: 2   levonorgestrel (KYLEENA) 19.5 MG IUD, by Intrauterine route., Disp: , Rfl:    pantoprazole (PROTONIX) 40 MG tablet, Take 1 tablet (40 mg total) by mouth daily., Disp: 90 tablet, Rfl: 2   Objective:     Ht 5\' 8"  (1.727 m)   Wt 195 lb (88.5 kg)   BMI 29.65 kg/m  {Vitals History (Optional):23777}  Physical Exam   No results found for any visits on 12/03/22.  {Labs (Optional):23779}  The ASCVD Risk score (Arnett DK, et al., 2019) failed to calculate for the following reasons:   The 2019 ASCVD risk score is only valid for ages 41 to 69    Assessment &  Plan:   Depression with anxiety    No follow-ups on file.    Mliss Sax, MDThis encounter was created in error - please disregard.

## 2022-12-07 ENCOUNTER — Telehealth: Payer: Self-pay | Admitting: Family Medicine

## 2022-12-15 NOTE — Progress Notes (Signed)
Erroneous encounter. Patient no show.

## 2023-01-16 ENCOUNTER — Other Ambulatory Visit: Payer: Self-pay | Admitting: Physician Assistant

## 2023-01-17 ENCOUNTER — Other Ambulatory Visit: Payer: Self-pay | Admitting: Physician Assistant

## 2023-04-04 ENCOUNTER — Other Ambulatory Visit: Payer: Self-pay | Admitting: Family Medicine

## 2023-04-04 DIAGNOSIS — F418 Other specified anxiety disorders: Secondary | ICD-10-CM

## 2023-04-20 ENCOUNTER — Ambulatory Visit: Admitting: Family Medicine

## 2023-04-22 ENCOUNTER — Encounter: Payer: Self-pay | Admitting: Family Medicine

## 2023-04-22 ENCOUNTER — Ambulatory Visit: Admitting: Family Medicine

## 2023-04-22 DIAGNOSIS — F418 Other specified anxiety disorders: Secondary | ICD-10-CM | POA: Diagnosis not present

## 2023-04-22 MED ORDER — BUPROPION HCL ER (XL) 300 MG PO TB24: 300.0000 mg | ORAL_TABLET | Freq: Every day | ORAL | 3 refills | Status: AC

## 2023-04-22 MED ORDER — ESCITALOPRAM OXALATE 20 MG PO TABS: 20.0000 mg | ORAL_TABLET | Freq: Every day | ORAL | 3 refills | Status: AC

## 2023-04-22 NOTE — Progress Notes (Signed)
 Established Patient Office Visit   Subjective:  Patient ID: Karen Webb, female    DOB: December 22, 2000  Age: 23 y.o. MRN: [id number]  Chief Complaint  Patient presents with   Medical Management of Chronic Issues    Follow up. Rx refills.     HPI Encounter Diagnoses  Name Primary?   Depression with anxiety    Doing well taking the Wellbutrin in the morning and the citalopram in the evening.  She is using melatonin as needed for sleep.  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  Things are going well with her significant other.  They are planning on buying a home together and she is about to purchase her first car.  She is also seeing a therapist   Review of Systems  Constitutional: Negative.   HENT: Negative.    Eyes:  Negative for blurred vision, discharge and redness.  Respiratory: Negative.    Cardiovascular: Negative.   Gastrointestinal:  Negative for abdominal pain.  Genitourinary: Negative.   Musculoskeletal: Negative.  Negative for myalgias.  Skin:  Negative for rash.  Neurological:  Negative for tingling, loss of consciousness and weakness.  Endo/Heme/Allergies:  Negative for polydipsia.      04/22/2023    4:31 PM 09/04/2022    2:37 PM 12/09/2021    3:12 PM  Depression screen PHQ 2/9  Decreased Interest 1 3 0  Down, Depressed, Hopeless 0 1 0  PHQ - 2 Score 1 4 0  Altered sleeping 2 2   Tired, decreased energy 1 3   Change in appetite 3 2   Feeling bad or failure about yourself  0 1   Trouble concentrating 2 2   Moving slowly or fidgety/restless 2 2   Suicidal thoughts 0 0   PHQ-9 Score 11 16   Difficult doing work/chores Somewhat difficult Very difficult       Current Outpatient Medications:    cetirizine (ZYRTEC) 10 MG tablet, Take 10 mg by mouth daily. Prn allergies, Disp: , Rfl:    dicyclomine (BENTYL) 20 MG tablet, TAKE 1 TABLET (20 MG TOTAL) BY MOUTH 3 (THREE) TIMES DAILY AS NEEDED FOR ABDOMINAL  PAIN, Disp: 90 tablet, Rfl: 2   fluticasone (FLONASE) 50 MCG/ACT nasal spray, Place into both nostrils daily. Prn allergies, Disp: , Rfl:    hydrOXYzine (ATARAX) 10 MG tablet, Take 1 tablet (10 mg total) by mouth 3 (three) times daily as needed for anxiety., Disp: 90 tablet, Rfl: 2   levonorgestrel (KYLEENA) 19.5 MG IUD, by Intrauterine route., Disp: , Rfl:    pantoprazole (PROTONIX) 40 MG tablet, Take 1 tablet (40 mg total) by mouth daily., Disp: 90 tablet, Rfl: 2   buPROPion (WELLBUTRIN XL) 300 MG 24 hr tablet, Take 1 tablet (300 mg total) by mouth daily., Disp: 90 tablet, Rfl: 3   escitalopram (LEXAPRO) 20 MG tablet, Take 1 tablet (20 mg total) by mouth daily., Disp: 90 tablet, Rfl: 3   famotidine (PEPCID) 40 MG tablet, TAKE 1 TABLET BY MOUTH EVERYDAY AT BEDTIME, Disp: 30 tablet, Rfl: 2   Objective:     BP 124/84 (BP Location: Left Arm, Patient Position: Sitting, Cuff Size: Normal)   Pulse 97   Temp 98.4 F (36.9 C) (Temporal)   Ht 5\' 8"  (1.727 m)   Wt 207 lb 12.8 oz (94.3 kg)   SpO2 98%   BMI 31.60 kg/m    Physical Exam Constitutional:  General: She is not in acute distress.    Appearance: Normal appearance. She is not ill-appearing, toxic-appearing or diaphoretic.  HENT:     Head: Normocephalic and atraumatic.     Right Ear: External ear normal.     Left Ear: External ear normal.  Eyes:     General: No scleral icterus.       Right eye: No discharge.        Left eye: No discharge.     Extraocular Movements: Extraocular movements intact.     Conjunctiva/sclera: Conjunctivae normal.  Pulmonary:     Effort: Pulmonary effort is normal. No respiratory distress.  Skin:    General: Skin is warm and dry.  Neurological:     Mental Status: She is alert and oriented to person, place, and time.  Psychiatric:        Mood and Affect: Mood normal.        Behavior: Behavior normal.      No results found for any visits on 04/22/23.    The ASCVD Risk score (Arnett DK, et  al., 2019) failed to calculate for the following reasons:   The 2019 ASCVD risk score is only valid for ages 20 to 82    Assessment & Plan:   Depression with anxiety -     buPROPion HCl ER (XL); Take 1 tablet (300 mg total) by mouth daily.  Dispense: 90 tablet; Refill: 3 -     Escitalopram Oxalate; Take 1 tablet (20 mg total) by mouth daily.  Dispense: 90 tablet; Refill: 3    Return in about 1 year (around 04/21/2024), or if symptoms worsen or fail to improve.  Continue above medications and follow-up with your therapist.  Follow-up in 1 year or sooner if needed.  Mliss Sax, MD

## 2023-07-14 IMAGING — DX DG SI JOINTS 3+V
3 series · 3 of 3 positions shown · non-contrast
Comparison: None.

CLINICAL DATA: Low back pain, no known injury, initial encounter

EXAM:
BILATERAL SACROILIAC JOINTS - 3+ VIEW

[pelvis ap]
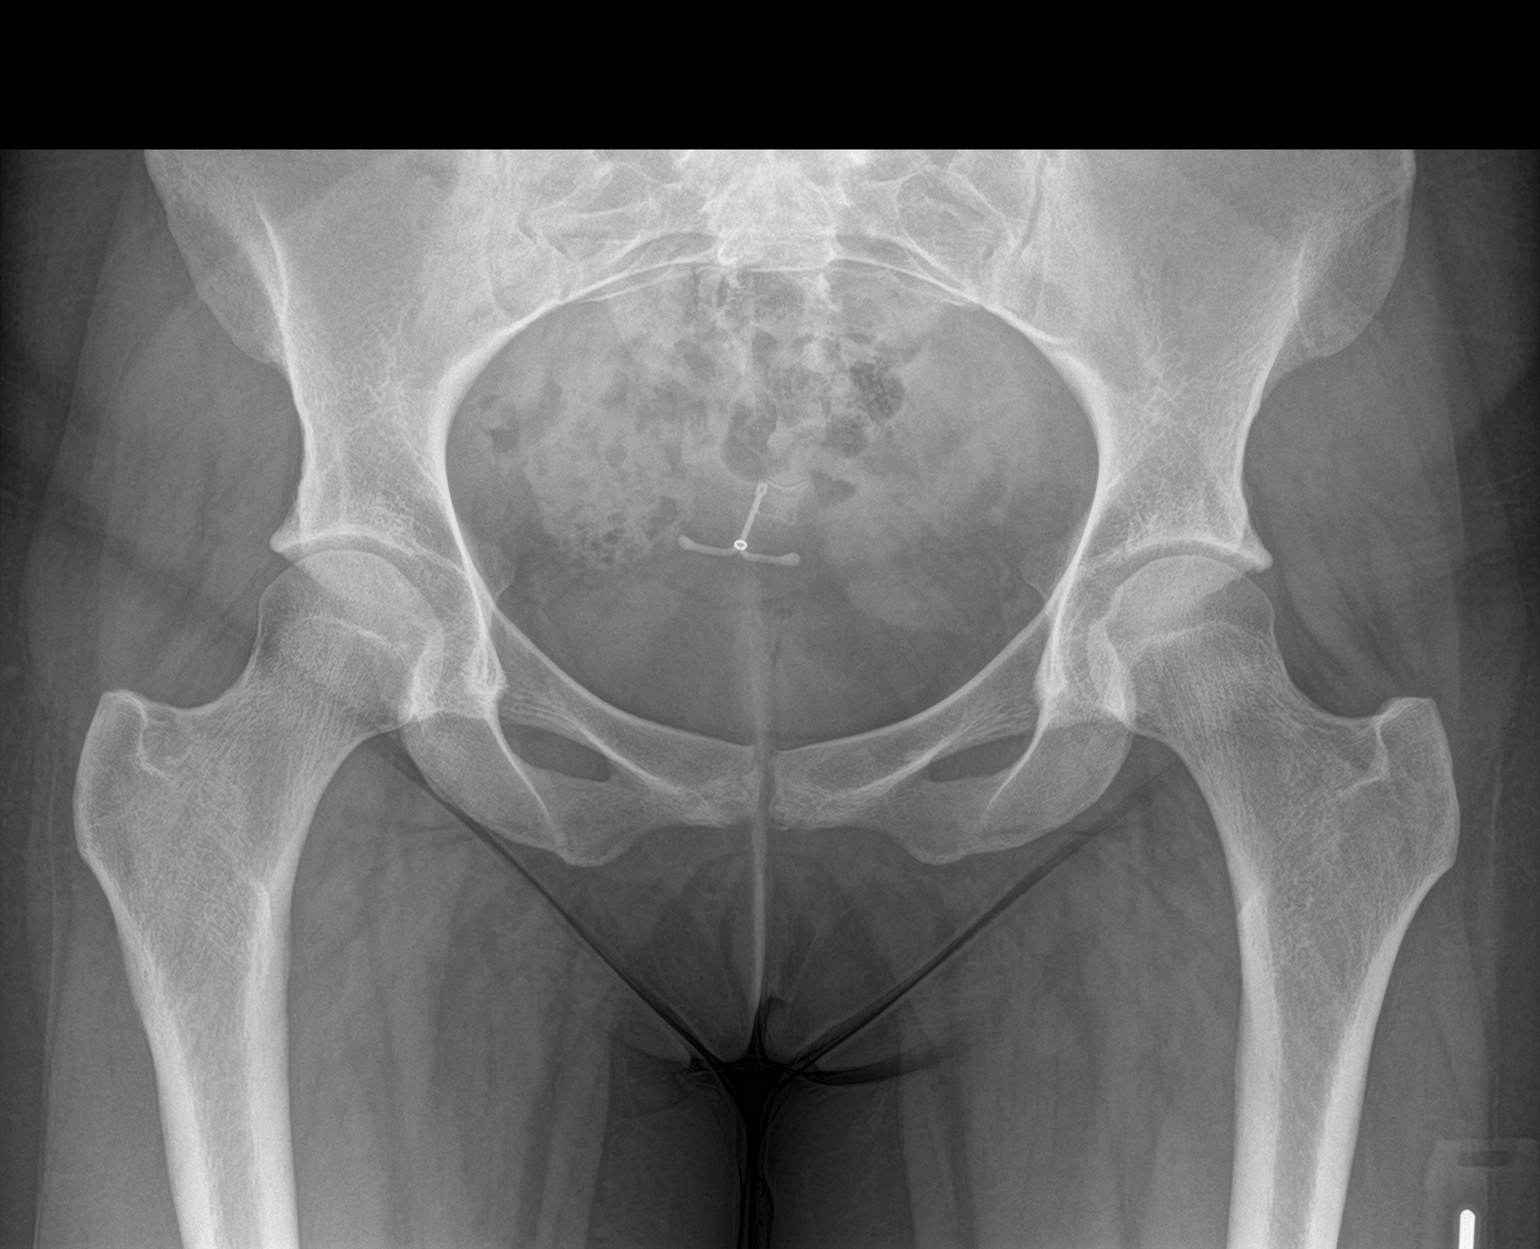

[si joint (1 of 2)]
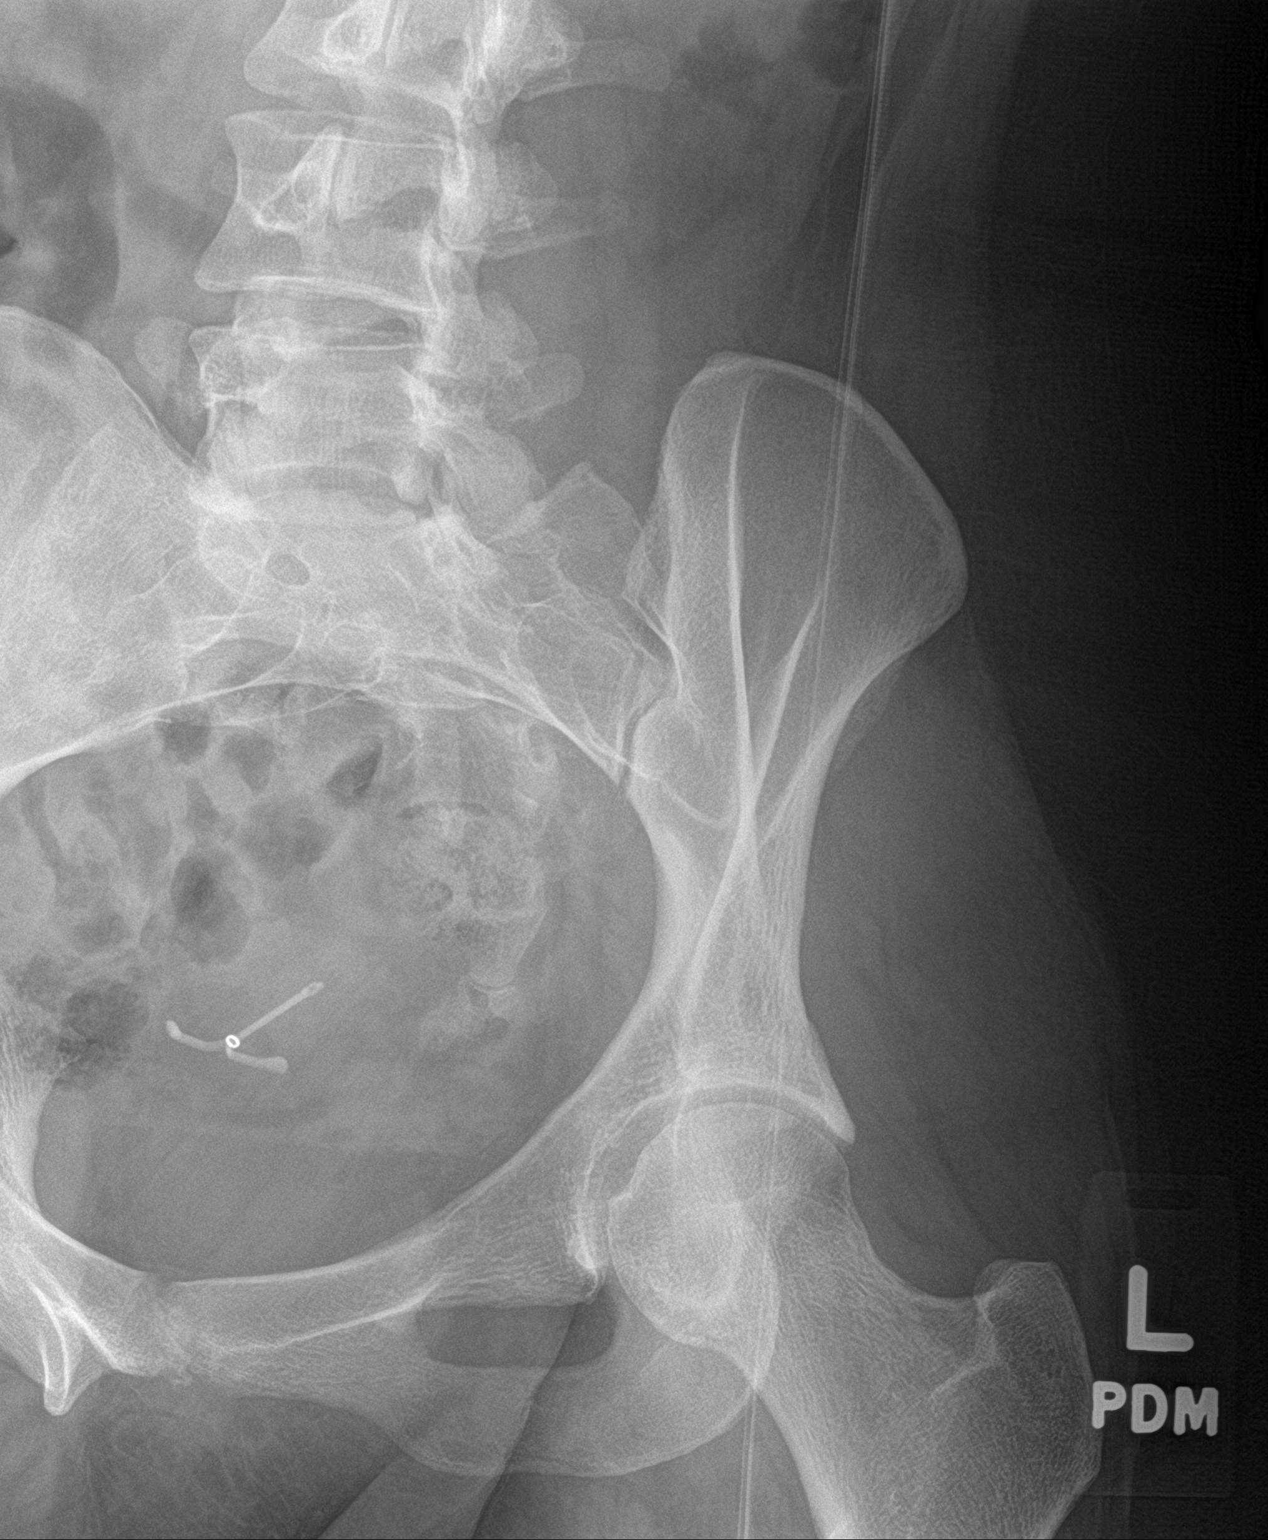

[si joint (2 of 2)]
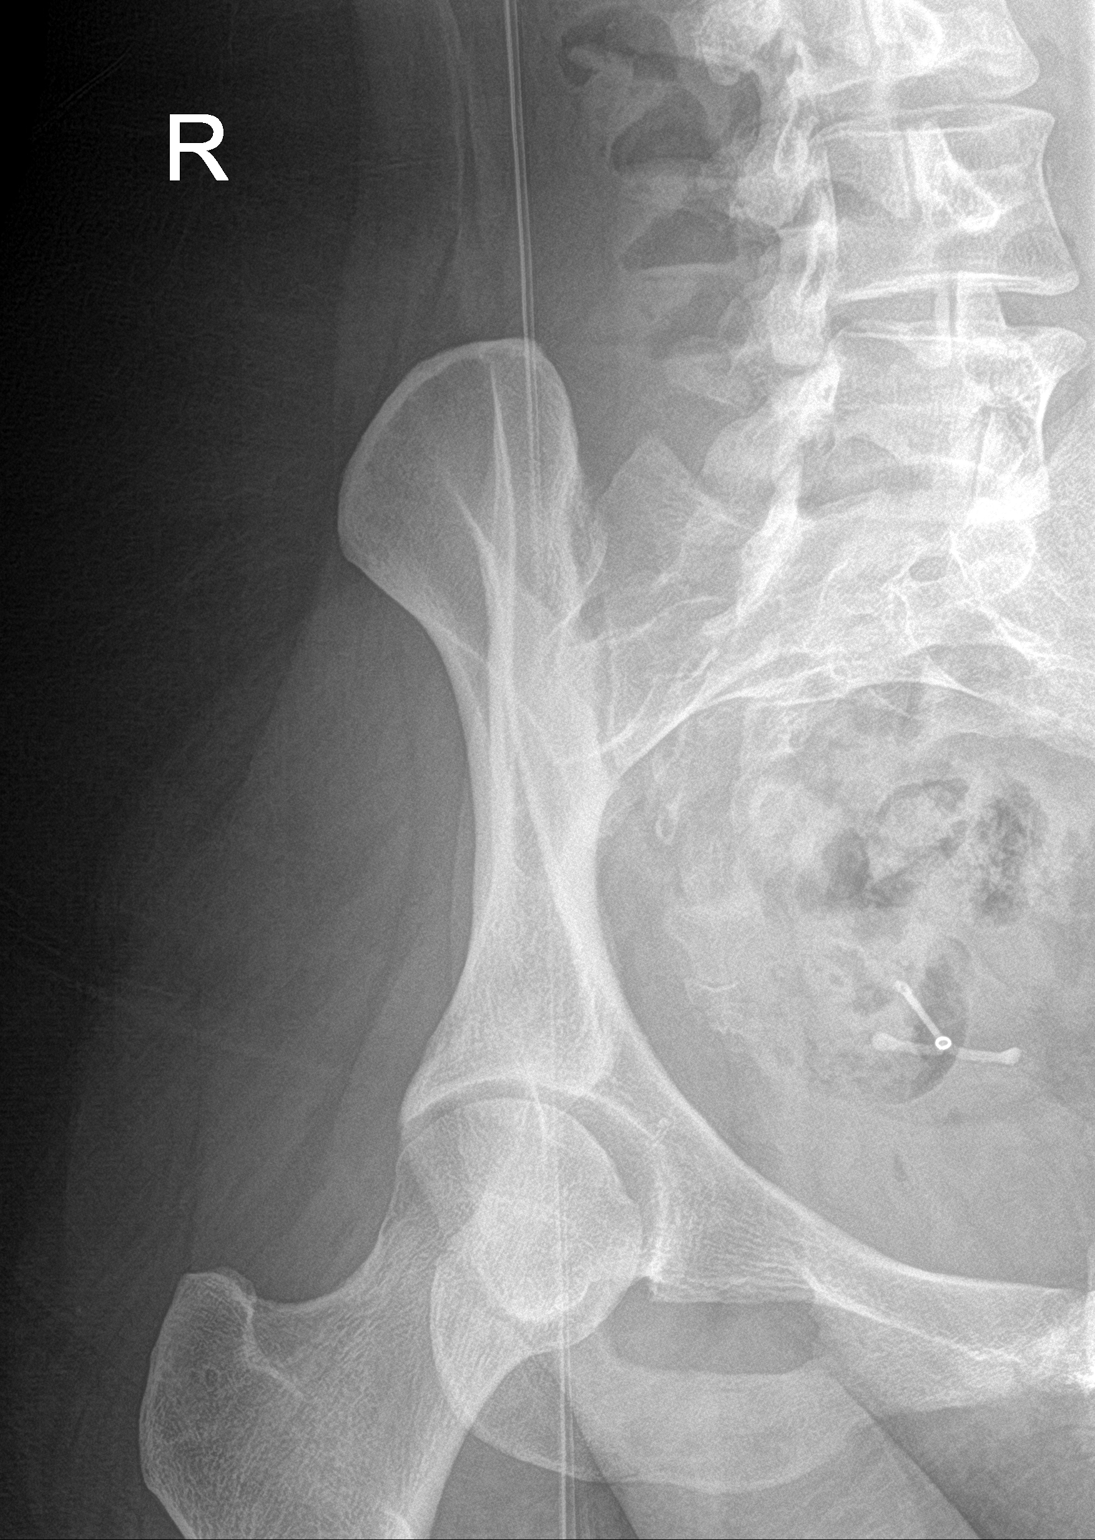

[3 of 3 positions shown; findings below may reference images not displayed]

FINDINGS: Pelvic ring is intact. Sacroiliac joints are patent bilaterally. IUD
is noted in place. No soft tissue abnormality is seen.
IMPRESSION: No acute abnormality noted.

## 2023-08-03 DIAGNOSIS — K580 Irritable bowel syndrome with diarrhea: Secondary | ICD-10-CM

## 2023-08-05 ENCOUNTER — Other Ambulatory Visit (INDEPENDENT_AMBULATORY_CARE_PROVIDER_SITE_OTHER)

## 2023-08-05 ENCOUNTER — Ambulatory Visit: Payer: Self-pay | Admitting: Physician Assistant

## 2023-08-05 DIAGNOSIS — K580 Irritable bowel syndrome with diarrhea: Secondary | ICD-10-CM | POA: Diagnosis not present

## 2023-08-05 LAB — CBC WITH DIFFERENTIAL/PLATELET
Basophils Absolute: 0 K/uL (ref 0.0–0.1)
Basophils Relative: 0.4 % (ref 0.0–3.0)
Eosinophils Absolute: 0.1 K/uL (ref 0.0–0.7)
Eosinophils Relative: 1 % (ref 0.0–5.0)
HCT: 40.1 % (ref 36.0–46.0)
Hemoglobin: 13.8 g/dL (ref 12.0–15.0)
Lymphocytes Relative: 28.8 % (ref 12.0–46.0)
Lymphs Abs: 1.5 K/uL (ref 0.7–4.0)
MCHC: 34.3 g/dL (ref 30.0–36.0)
MCV: 87.8 fl (ref 78.0–100.0)
Monocytes Absolute: 0.3 K/uL (ref 0.1–1.0)
Monocytes Relative: 5.9 % (ref 3.0–12.0)
Neutro Abs: 3.2 K/uL (ref 1.4–7.7)
Neutrophils Relative %: 63.9 % (ref 43.0–77.0)
Platelets: 200 K/uL (ref 150.0–400.0)
RBC: 4.57 Mil/uL (ref 3.87–5.11)
RDW: 13.1 % (ref 11.5–15.5)
WBC: 5 K/uL (ref 4.0–10.5)

## 2023-08-05 LAB — COMPREHENSIVE METABOLIC PANEL WITH GFR
ALT: 14 U/L (ref 0–35)
AST: 14 U/L (ref 0–37)
Albumin: 4.6 g/dL (ref 3.5–5.2)
Alkaline Phosphatase: 54 U/L (ref 39–117)
BUN: 10 mg/dL (ref 6–23)
CO2: 29 meq/L (ref 19–32)
Calcium: 9.5 mg/dL (ref 8.4–10.5)
Chloride: 102 meq/L (ref 96–112)
Creatinine, Ser: 0.64 mg/dL (ref 0.40–1.20)
GFR: 124.51 mL/min (ref >60.00)
Glucose, Bld: 112 mg/dL — ABNORMAL HIGH (ref 70–99)
Potassium: 4.1 meq/L (ref 3.5–5.1)
Sodium: 138 meq/L (ref 135–145)
Total Bilirubin: 0.4 mg/dL (ref 0.2–1.2)
Total Protein: 7.5 g/dL (ref 6.0–8.3)

## 2023-08-05 LAB — C-REACTIVE PROTEIN: CRP: <1 mg/dL (ref 0.5–20.0)

## 2023-08-05 LAB — SEDIMENTATION RATE: Sed Rate: 6 mm/h (ref 0–20)

## 2023-08-06 ENCOUNTER — Ambulatory Visit: Admitting: Family Medicine

## 2023-08-09 ENCOUNTER — Ambulatory Visit: Admitting: Family Medicine

## 2023-08-19 ENCOUNTER — Encounter: Payer: Self-pay | Admitting: Family Medicine

## 2023-08-19 ENCOUNTER — Other Ambulatory Visit: Payer: Self-pay | Admitting: Family Medicine

## 2023-08-19 DIAGNOSIS — F418 Other specified anxiety disorders: Secondary | ICD-10-CM

## 2023-08-30 NOTE — Progress Notes (Unsigned)
 08/31/2023 Karen Webb [id number] [birth year]  Referring provider: Berneta Elsie Sayre,* Primary GI doctor: Dr. Federico  ASSESSMENT AND PLAN:  Lower abdominal pain worse before and during BM, can have days without a BM, can be hard stools or diarrhea, urgency bloating, early satiety, increased mucus in her stool worsening last month, history of IBS-D, incomplete bowel movements 05/08/2021 unremarkable colonoscopy 10/25/2022 CT ABP W for AB pain, colonic wall thickening involving the splenic flexure and descending colon with mild surrounding fat stranding compatible with colitis, moderate amount of retained stool in the colon suggesting constipation, hepatic steatosis. Never given ABX, symptoms resolved with BM  08/05/2023 sed rate, CRP normal, no anemia no leukocytosis, normal kidney liver - consider RUQ US , consider HIDA - check fecal calprotectin, consider repeat imaging versus colonoscopy - KUB -xifaxin trial for IBS-D - consider pelvic floor PT, information given -Can do trial of IBGARD daily, continue Bentyl , can try levsin  -FODMAP,  and lifestyle changes discussed  GERD 05/08/21 EGD negative H. pylori gastritis Controlled at this time Consider RUQ US   Anxiety Possibly contributing to the IBS Discuss with PCP  Hepatic steatosis    Latest Ref Rng & Units 08/05/2023    1:10 PM 10/25/2022    2:56 AM 10/13/2022    4:22 PM  Hepatic Function  Total Protein 6.0 - 8.3 g/dL 7.5  7.6  7.6   Albumin 3.5 - 5.2 g/dL 4.6  4.4  4.8   AST 0 - 37 U/L 14  26  34   ALT 0 - 35 U/L 14  22  48   Alk Phosphatase 39 - 117 U/L 54  56  66   Total Bilirubin 0.2 - 1.2 mg/dL 0.4  0.9  0.7    Platelets 200.0  - need LFTs and CBC monitored every 6 months, - evaluation with imaging every 2-3 years.  - Encouraged diet/exercise for modest 10% body weight loss as treatment for hepatic steatosis -Continue to work on risk factor modification including diet exercise and control of risk factors  including blood sugars.   Patient Care Team: Berneta Elsie Sayre, MD as PCP - General (Family Medicine)  HISTORY OF PRESENT ILLNESS: 23 y.o. female with a past medical history of depression, constipation, hemorrhoids and  listed below presents for evaluation of AB pain, diarrhea.   Last seen in the office 10/09/2022 for IBS-D  Discussed the use of AI scribe software for clinical note transcription with the patient, who gave verbal consent to proceed.  History of Present Illness   Karen Webb is a 23 year old female with IBS who presents with abdominal pain, bloating, and changes in bowel habits.  She has been experiencing significant gastrointestinal symptoms for the past month, including sharp cramping pain in her lower abdomen, which is more pronounced before and during bowel movements. The pain ranges from moderate to severe and can last from an hour to all day. She feels 'super full' after small meals and experiences daily discomfort, including bloating and abdominal pain.  Her bowel habits alternate between diarrhea and constipation. When constipated, she may not have a bowel movement for a day, and when she does, it starts hard and ends in diarrhea. On days with bowel movements, they are soft, leaning towards diarrhea. She often feels like she does not fully empty her bowels and has noticed mucus in her stool, sometimes occurring without a bowel movement. She has also observed a small amount of blood, which she attributes to hemorrhoids.  Certain  triggers exacerbate her symptoms, such as lack of sleep and greasy foods. Despite adequate sleep, she often feels tired and requires naps. She carries dicyclomine  with her at all times. In October, she had an emergency room visit due to severe abdominal pain, where she was treated with fentanyl  and experienced relief after a bowel movement. Recent lab work showed no anemia, normal liver and kidney function, and no markers of inflammation.  She has a history of a normal colonoscopy and CT scan, although the CT indicated significant fecal retention.      She  reports that she has never smoked. She has never used smokeless tobacco. She reports current alcohol use. She reports that she does not use drugs.  RELEVANT GI HISTORY, IMAGING AND LABS: Results   LABS Sed rate: Negative (08/05/2023) CRP: Negative (08/05/2023)  RADIOLOGY CT abdomen: Constipation with significant fecal retention (October 2024)  DIAGNOSTIC Colonoscopy: Normal (April 2023)      CBC    Component Value Date/Time   WBC 5.0 08/05/2023 1310   RBC 4.57 08/05/2023 1310   HGB 13.8 08/05/2023 1310   HCT 40.1 08/05/2023 1310   PLT 200.0 08/05/2023 1310   MCV 87.8 08/05/2023 1310   MCH 30.4 10/25/2022 0256   MCHC 34.3 08/05/2023 1310   RDW 13.1 08/05/2023 1310   LYMPHSABS 1.5 08/05/2023 1310   MONOABS 0.3 08/05/2023 1310   EOSABS 0.1 08/05/2023 1310   BASOSABS 0.0 08/05/2023 1310   Recent Labs    10/13/22 1622 10/25/22 0256 08/05/23 1310  HGB 13.4 13.7 13.8    CMP     Component Value Date/Time   NA 138 08/05/2023 1310   K 4.1 08/05/2023 1310   CL 102 08/05/2023 1310   CO2 29 08/05/2023 1310   GLUCOSE 112 (H) 08/05/2023 1310   BUN 10 08/05/2023 1310   CREATININE 0.64 08/05/2023 1310   CALCIUM 9.5 08/05/2023 1310   PROT 7.5 08/05/2023 1310   ALBUMIN 4.6 08/05/2023 1310   AST 14 08/05/2023 1310   ALT 14 08/05/2023 1310   ALKPHOS 54 08/05/2023 1310   BILITOT 0.4 08/05/2023 1310   GFRNONAA >60 10/25/2022 0256      Latest Ref Rng & Units 08/05/2023    1:10 PM 10/25/2022    2:56 AM 10/13/2022    4:22 PM  Hepatic Function  Total Protein 6.0 - 8.3 g/dL 7.5  7.6  7.6   Albumin 3.5 - 5.2 g/dL 4.6  4.4  4.8   AST 0 - 37 U/L 14  26  34   ALT 0 - 35 U/L 14  22  48   Alk Phosphatase 39 - 117 U/L 54  56  66   Total Bilirubin 0.2 - 1.2 mg/dL 0.4  0.9  0.7       Current Medications:   Current Outpatient Medications (Endocrine &  Metabolic):    levonorgestrel (KYLEENA) 19.5 MG IUD, by Intrauterine route.   Current Outpatient Medications (Respiratory):    cetirizine (ZYRTEC) 10 MG tablet, Take 10 mg by mouth daily. Prn allergies   fluticasone (FLONASE) 50 MCG/ACT nasal spray, Place into both nostrils daily. Prn allergies    Current Outpatient Medications (Other):    buPROPion  (WELLBUTRIN  XL) 300 MG 24 hr tablet, Take 1 tablet (300 mg total) by mouth daily.   dicyclomine  (BENTYL ) 20 MG tablet, TAKE 1 TABLET (20 MG TOTAL) BY MOUTH 3 (THREE) TIMES DAILY AS NEEDED FOR ABDOMINAL PAIN   escitalopram  (LEXAPRO ) 20 MG tablet, Take 1 tablet (20  mg total) by mouth daily.   famotidine  (PEPCID ) 40 MG tablet, TAKE 1 TABLET BY MOUTH EVERYDAY AT BEDTIME   hydrOXYzine  (ATARAX ) 10 MG tablet, Take 1 tablet (10 mg total) by mouth 3 (three) times daily as needed for anxiety.   hyoscyamine  (LEVSIN  SL) 0.125 MG SL tablet, Place 1 tablet (0.125 mg total) under the tongue every 6 (six) hours as needed for cramping (nausea, diarrhea).   pantoprazole  (PROTONIX ) 40 MG tablet, Take 1 tablet (40 mg total) by mouth daily.   rifaximin  (XIFAXAN ) 550 MG TABS tablet, Take 1 tablet (550 mg total) by mouth 3 (three) times daily for 14 days.  Medical History:  Past Medical History:  Diagnosis Date   Allergy    Anxiety    Depression    GERD (gastroesophageal reflux disease)    Gluten-sensitive enteropathy    IBS (irritable bowel syndrome)    Kyphosis    slight   Allergies: No Known Allergies   Surgical History:  She  has a past surgical history that includes Wisdom tooth extraction; Colonoscopy (05/08/2021); and Upper gastrointestinal endoscopy (05/08/2021). Family History:  Her family history includes ADD / ADHD in her sister; Alcohol abuse in her mother; Anxiety disorder in her mother; Bipolar disorder in her mother; Celiac disease in her maternal grandmother; Crohn's disease in her mother; Depression in her mother and sister; Heart disease  in her paternal grandfather; Hypertension in her father; Irritable bowel syndrome in her mother; Other in her mother; Sleep apnea in her father.  REVIEW OF SYSTEMS  : All other systems reviewed and negative except where noted in the History of Present Illness.  PHYSICAL EXAM: BP 110/70 (BP Location: Left Arm, Patient Position: Sitting, Cuff Size: Normal)   Pulse 94   Ht 5' 7 (1.702 m)   Wt 204 lb (92.5 kg)   BMI 31.95 kg/m  Physical Exam   GENERAL APPEARANCE: Well nourished, in no apparent distress. HEENT: No cervical lymphadenopathy, unremarkable thyroid , sclerae anicteric, conjunctiva pink. RESPIRATORY: Respiratory effort normal, breath sounds equal bilateral without rales, rhonchi, wheezing. Lungs clear to auscultation. CARDIO: Regular rate and rhythm with no murmurs, rubs, or gallops, peripheral pulses intact. ABDOMEN: Soft, non-distended, active bowel sounds in all four quadrants, tender to palpation, no rebound, no mass appreciated. RECTAL: Declines. MUSCULOSKELETAL: Full range of motion, normal gait, without edema. SKIN: Dry, intact without rashes or lesions. No jaundice. NEURO: Alert, oriented, no focal deficits. PSYCH: Cooperative, normal mood and affect.      Alan JONELLE Coombs, PA-C 11:13 AM

## 2023-08-31 ENCOUNTER — Ambulatory Visit (INDEPENDENT_AMBULATORY_CARE_PROVIDER_SITE_OTHER): Admitting: Physician Assistant

## 2023-08-31 ENCOUNTER — Other Ambulatory Visit (INDEPENDENT_AMBULATORY_CARE_PROVIDER_SITE_OTHER)

## 2023-08-31 ENCOUNTER — Ambulatory Visit
Admission: RE | Admit: 2023-08-31 | Discharge: 2023-08-31 | Disposition: A | Discharge status: Home / Self Care | Source: Ambulatory Visit | Attending: Physician Assistant | Admitting: Physician Assistant

## 2023-08-31 ENCOUNTER — Encounter: Payer: Self-pay | Admitting: Physician Assistant

## 2023-08-31 VITALS — BP 110/70 | HR 94 | Ht 67.0 in | Wt 204.0 lb

## 2023-08-31 DIAGNOSIS — K580 Irritable bowel syndrome with diarrhea: Secondary | ICD-10-CM

## 2023-08-31 DIAGNOSIS — K219 Gastro-esophageal reflux disease without esophagitis: Secondary | ICD-10-CM

## 2023-08-31 DIAGNOSIS — K760 Fatty (change of) liver, not elsewhere classified: Secondary | ICD-10-CM

## 2023-08-31 DIAGNOSIS — K2970 Gastritis, unspecified, without bleeding: Secondary | ICD-10-CM

## 2023-08-31 DIAGNOSIS — R933 Abnormal findings on diagnostic imaging of other parts of digestive tract: Secondary | ICD-10-CM | POA: Diagnosis not present

## 2023-08-31 LAB — COMPREHENSIVE METABOLIC PANEL WITH GFR
ALT: 16 U/L (ref 0–35)
AST: 16 U/L (ref 0–37)
Albumin: 4.8 g/dL (ref 3.5–5.2)
Alkaline Phosphatase: 53 U/L (ref 39–117)
BUN: 8 mg/dL (ref 6–23)
CO2: 25 meq/L (ref 19–32)
Calcium: 9.3 mg/dL (ref 8.4–10.5)
Chloride: 103 meq/L (ref 96–112)
Creatinine, Ser: 0.65 mg/dL (ref 0.40–1.20)
GFR: 123.99 mL/min (ref >60.00)
Glucose, Bld: 89 mg/dL (ref 70–99)
Potassium: 3.8 meq/L (ref 3.5–5.1)
Sodium: 138 meq/L (ref 135–145)
Total Bilirubin: 0.6 mg/dL (ref 0.2–1.2)
Total Protein: 7.5 g/dL (ref 6.0–8.3)

## 2023-08-31 LAB — CBC WITH DIFFERENTIAL/PLATELET
Basophils Absolute: 0 K/uL (ref 0.0–0.1)
Basophils Relative: 0.5 % (ref 0.0–3.0)
Eosinophils Absolute: 0 K/uL (ref 0.0–0.7)
Eosinophils Relative: 1.1 % (ref 0.0–5.0)
HCT: 41.6 % (ref 36.0–46.0)
Hemoglobin: 14.4 g/dL (ref 12.0–15.0)
Lymphocytes Relative: 33.4 % (ref 12.0–46.0)
Lymphs Abs: 1.5 K/uL (ref 0.7–4.0)
MCHC: 34.6 g/dL (ref 30.0–36.0)
MCV: 87.5 fl (ref 78.0–100.0)
Monocytes Absolute: 0.3 K/uL (ref 0.1–1.0)
Monocytes Relative: 6.3 % (ref 3.0–12.0)
Neutro Abs: 2.7 K/uL (ref 1.4–7.7)
Neutrophils Relative %: 58.7 % (ref 43.0–77.0)
Platelets: 219 K/uL (ref 150.0–400.0)
RBC: 4.75 Mil/uL (ref 3.87–5.11)
RDW: 13 % (ref 11.5–15.5)
WBC: 4.5 K/uL (ref 4.0–10.5)

## 2023-08-31 LAB — HIGH SENSITIVITY CRP: CRP, High Sensitivity: 1.77 mg/L (ref 0.000–5.000)

## 2023-08-31 LAB — TSH: TSH: 1.34 u[IU]/mL (ref 0.35–5.50)

## 2023-08-31 MED ORDER — HYOSCYAMINE SULFATE 0.125 MG SL SUBL: 0.1250 mg | SUBLINGUAL_TABLET | Freq: Four times a day (QID) | SUBLINGUAL | 1 refills | Status: AC | PRN

## 2023-08-31 MED ORDER — RIFAXIMIN 550 MG PO TABS
550.0000 mg | ORAL_TABLET | Freq: Three times a day (TID) | ORAL | 0 refills | Status: AC
Start: 2023-08-31 — End: 2023-09-14

## 2023-08-31 NOTE — Patient Instructions (Addendum)
 Your provider has requested that you go to the basement level for lab work before leaving today. Press B on the elevator. The lab is located at the first door on the left as you exit the elevator.  Your provider has requested that you have an abdominal x ray before leaving today. Please go to the basement floor to our Radiology department for the test.  VISIT SUMMARY:  During today's visit, we discussed your ongoing gastrointestinal symptoms, including abdominal pain, bloating, and changes in bowel habits. We reviewed your history of irritable bowel syndrome (IBS) and recent lab results, which were normal.   YOUR PLAN:  -IRRITABLE BOWEL SYNDROME (IBS): IBS is a common disorder that affects the large intestine, causing symptoms like cramping, abdominal pain, bloating, gas, and diarrhea or constipation. We will order a stool test to check for inflammation and prescribe Xifaxan  to help manage your symptoms, pending insurance approval. An abdominal x-ray will be done to check for fecal loading. We also discussed the FODMAP diet and pelvic floor exercises as part of your management plan. Additionally, hyoscyamine  may be used for acute pain relief.  -CHRONIC CONSTIPATION: Chronic constipation is a condition where you have infrequent bowel movements or difficulty passing stools. We will order an abdominal x-ray to assess fecal loading and may consider medication to help with bowel movements based on the x-ray results.  INSTRUCTIONS:  Please complete the stool test and abdominal x-ray as soon as possible. Follow the FODMAP diet and consider pelvic floor exercises to help manage your IBS symptoms. If you experience acute pain, you may use hyoscyamine  as discussed. We will review the results of your tests and adjust your treatment plan as needed. If you have any questions or concerns, please do not hesitate to contact our office.  First do a trial off milk/lactose products if you use them.  Add fiber like  benefiber or citracel once a day Increase activity Can do trial of IBGard which is over the counter for AB pain- Take 1-2 capsules once a day for maintence or twice a day during a flare Can send in an anti spasm medication, Bentyl , to take as needed Please try to decrease stress. consider talking with PCP about anti anxiety medication or try head space app for meditation. if any worsening symptoms like blood in stool, weight loss, please call the office     FODMAP stands for fermentable oligo-, di-, mono-saccharides and polyols (1). These are the scientific terms used to classify groups of carbs that are difficult for our body to digest and that are notorious for triggering digestive symptoms like bloating, gas, loose stools and stomach pain.   You can try low FODMAP diet  - start with eliminating just one column at a time that you feel may be a trigger for you. - the table at the very bottom contains foods that are low in FODMAPs   Sometimes trying to eliminate the FODMAP's from your diet is difficult or tricky, if you are stuggling with trying to do the elimination diet you can try an enzyme.  There is a food enzymes that you sprinkle in or on your food that helps break down the FODMAP. You can read more about the enzyme by going to this site: https://fodzyme.com/  Toileting tips to help with your constipation - Drink at least 64-80 ounces of water/liquid per day. - Establish a time to try to move your bowels every day.  For many people, this is after a cup of coffee  or after a meal such as breakfast. - Sit all of the way back on the toilet keeping your back fairly straight and while sitting up, try to rest the tops of your forearms on your upper thighs.   - Raising your feet with a step stool/squatty potty can be helpful to improve the angle that allows your stool to pass through the rectum. - Relax the rectum feeling it bulge toward the toilet water.  If you feel your rectum raising  toward your body, you are contracting rather than relaxing. - Breathe in and slowly exhale. Belly breath by expanding your belly towards your belly button. Keep belly expanded as you gently direct pressure down and back to the anus.  A low pitched GRRR sound can assist with increasing intra-abdominal pressure.  (Can also trying to blow on a pinwheel and make it move, this helps with the same belly breathing) - Repeat 3-4 times. If unsuccessful, contract the pelvic floor to restore normal tone and get off the toilet.  Avoid excessive straining. - To reduce excessive wiping by teaching your anus to normally contract, place hands on outer aspect of knees and resist knee movement outward.  Hold 5-10 second then place hands just inside of knees and resist inward movement of knees.  Hold 5 seconds.  Repeat a few times each way.  Go to the ER if unable to pass gas, severe AB pain, unable to hold down food, any shortness of breath of chest pain.  Here some information about pelvic floor dysfunction. This may be contributing to some of your symptoms. We will continue with our evaluation but I do want you to consider adding on fiber supplement with low-dose MiraLAX daily. We could also refer to pelvic floor physical therapy.   Pelvic Floor Dysfunction, Female Pelvic floor dysfunction (PFD) is a condition that results when the group of muscles and connective tissues that support the organs in the pelvis (pelvic floor muscles) do not work well. These muscles and their connections form a sling that supports the colon and bladder. In women, they also support the uterus. PFD causes pelvic floor muscles to be too weak, too tight, or both. In PFD, muscle movements are not coordinated. This may cause bowel or bladder problems. It may also cause pain. What are the causes? This condition may be caused by an injury to the pelvic area or by a weakening of pelvic muscles. This often results from pregnancy and  childbirth or other types of strain. In many cases, the exact cause is not known. What increases the risk? The following factors may make you more likely to develop this condition: Having chronic bladder tissue inflammation (interstitial cystitis). Being an older person. Being overweight. History of radiation treatment for cancer in the pelvic region. Previous pelvic surgery, such as removal of the uterus (hysterectomy). What are the signs or symptoms? Symptoms of this condition vary and may include: Bladder symptoms, such as: Trouble starting urination and emptying the bladder. Frequent urinary tract infections. Leaking urine when coughing, laughing, or exercising (stress incontinence). Having to pass urine urgently or frequently. Pain when passing urine. Bowel symptoms, such as: Constipation. Urgent or frequent bowel movements. Incomplete bowel movements. Painful bowel movements. Leaking stool or gas. Unexplained genital or rectal pain. Genital or rectal muscle spasms. Low back pain. Other symptoms may include: A heavy, full, or aching feeling in the vagina. A bulge that protrudes into the vagina. Pain during or after sex. How is this diagnosed? This condition may  be diagnosed based on: Your symptoms and medical history. A physical exam. During the exam, your health care provider may check your pelvic muscles for tightness, spasm, pain, or weakness. This may include a rectal exam and a pelvic exam. In some cases, you may have diagnostic tests, such as: Electrical muscle function tests. Urine flow testing. X-ray tests of bowel function. Ultrasound of the pelvic organs. How is this treated? Treatment for this condition depends on the symptoms. Treatment options include: Physical therapy. This may include Kegel exercises to help relax or strengthen the pelvic floor muscles. Biofeedback. This type of therapy provides feedback on how tight your pelvic floor muscles are so that  you can learn to control them. Internal or external massage therapy. A treatment that involves electrical stimulation of the pelvic floor muscles to help control pain (transcutaneous electrical nerve stimulation, or TENS). Sound wave therapy (ultrasound) to reduce muscle spasms. Medicines, such as: Muscle relaxants. Bladder control medicines. Surgery to reconstruct or support pelvic floor muscles may be an option if other treatments do not help. Follow these instructions at home: Activity Do your usual activities as told by your health care provider. Ask your health care provider if you should modify any activities. Do pelvic floor strengthening or relaxing exercises at home as told by your physical therapist. Lifestyle Maintain a healthy weight. Eat foods that are high in fiber, such as beans, whole grains, and fresh fruits and vegetables. Limit foods that are high in fat and processed sugars, such as fried or sweet foods. Manage stress with relaxation techniques such as yoga or meditation. General instructions If you have problems with leakage: Use absorbable pads or wear padded underwear. Wash frequently with mild soap. Keep your genital and anal area as clean and dry as possible. Ask your health care provider if you should try a barrier cream to prevent skin irritation. Take warm baths to relieve pelvic muscle tension or spasms. Take over-the-counter and prescription medicines only as told by your health care provider. Keep all follow-up visits. How is this prevented? The cause of PFD is not always known, but there are a few things you can do to reduce the risk of developing this condition, including: Staying at a healthy weight. Getting regular exercise. Managing stress. Contact a health care provider if: Your symptoms are not improving with home care. You have signs or symptoms of PFD that get worse at home. You develop new signs or symptoms. You have signs of a urinary tract  infection, such as: Fever. Chills. Increased urinary frequency. A burning feeling when urinating. You have not had a bowel movement in 3 days (constipation). Summary Pelvic floor dysfunction results when the muscles and connective tissues in your pelvic floor do not work well. These muscles and their connections form a sling that supports your colon and bladder. In women, they also support the uterus. PFD may be caused by an injury to the pelvic area or by a weakening of pelvic muscles. PFD causes pelvic floor muscles to be too weak, too tight, or a combination of both. Symptoms may vary from person to person. In most cases, PFD can be treated with physical therapies and medicines. Surgery may be an option if other treatments do not help. This information is not intended to replace advice given to you by your health care provider. Make sure you discuss any questions you have with your health care provider. Document Revised: 05/08/2020 Document Reviewed: 05/08/2020 [employer] Patient Education  2022 [employer].

## 2023-08-31 NOTE — Progress Notes (Signed)
 I agree with the assessment and plan as outlined by Ms. Craig.

## 2023-09-01 ENCOUNTER — Ambulatory Visit: Payer: Self-pay | Admitting: Physician Assistant

## 2023-09-02 ENCOUNTER — Ambulatory Visit: Admitting: Family Medicine

## 2023-09-02 ENCOUNTER — Other Ambulatory Visit (HOSPITAL_COMMUNITY): Payer: Self-pay

## 2023-09-07 ENCOUNTER — Other Ambulatory Visit (HOSPITAL_COMMUNITY): Payer: Self-pay

## 2023-09-09 ENCOUNTER — Other Ambulatory Visit (HOSPITAL_COMMUNITY): Payer: Self-pay

## 2023-09-14 ENCOUNTER — Other Ambulatory Visit (HOSPITAL_COMMUNITY): Payer: Self-pay

## 2023-09-14 NOTE — Telephone Encounter (Signed)
 I was having issues with her insurances pulling up information so I contacted them and they state that the plan was terminated 06-2022. This is based off the [employer] card we have on file that was scanned into the system in April. Will need to have updated pharmacy benefits information as our system does not pull anything up.

## 2023-10-23 ENCOUNTER — Encounter: Payer: Self-pay | Admitting: Family Medicine

## 2023-10-25 ENCOUNTER — Other Ambulatory Visit: Payer: Self-pay | Admitting: Physician Assistant

## 2023-10-25 DIAGNOSIS — K2970 Gastritis, unspecified, without bleeding: Secondary | ICD-10-CM

## 2024-01-21 ENCOUNTER — Ambulatory Visit: Payer: Self-pay | Admitting: Family Medicine

## 2024-03-10 ENCOUNTER — Ambulatory Visit: Admitting: Family Medicine
